# Patient Record
Sex: Female | Born: 1975 | Race: Black or African American | Hispanic: No | Smoking: Former smoker
Health system: Southern US, Community
[De-identification: ages and names within clinical notes are randomized; demographics above are authoritative.]

## PROBLEM LIST (undated history)

## (undated) DIAGNOSIS — Z808 Family history of malignant neoplasm of other organs or systems: Secondary | ICD-10-CM

## (undated) DIAGNOSIS — Z8041 Family history of malignant neoplasm of ovary: Secondary | ICD-10-CM

## (undated) DIAGNOSIS — C50919 Malignant neoplasm of unspecified site of unspecified female breast: Secondary | ICD-10-CM

## (undated) DIAGNOSIS — I10 Essential (primary) hypertension: Secondary | ICD-10-CM

## (undated) DIAGNOSIS — E785 Hyperlipidemia, unspecified: Secondary | ICD-10-CM

## (undated) DIAGNOSIS — F419 Anxiety disorder, unspecified: Secondary | ICD-10-CM

## (undated) DIAGNOSIS — J302 Other seasonal allergic rhinitis: Secondary | ICD-10-CM

## (undated) DIAGNOSIS — Z803 Family history of malignant neoplasm of breast: Secondary | ICD-10-CM

## (undated) HISTORY — DX: Malignant neoplasm of unspecified site of unspecified female breast: C50.919

## (undated) HISTORY — DX: Family history of malignant neoplasm of other organs or systems: Z80.8

## (undated) HISTORY — DX: Family history of malignant neoplasm of breast: Z80.3

## (undated) HISTORY — DX: Family history of malignant neoplasm of ovary: Z80.41

---

## 2003-11-16 ENCOUNTER — Other Ambulatory Visit: Admission: RE | Admit: 2003-11-16 | Discharge: 2003-11-16 | Payer: Self-pay | Admitting: Family Medicine

## 2005-01-22 ENCOUNTER — Other Ambulatory Visit: Admission: RE | Admit: 2005-01-22 | Discharge: 2005-01-22 | Payer: Self-pay | Admitting: Family Medicine

## 2007-03-18 ENCOUNTER — Other Ambulatory Visit: Admission: RE | Admit: 2007-03-18 | Discharge: 2007-03-18 | Payer: Self-pay | Admitting: Family Medicine

## 2008-06-18 ENCOUNTER — Other Ambulatory Visit: Admission: RE | Admit: 2008-06-18 | Discharge: 2008-06-18 | Payer: Self-pay | Admitting: Family Medicine

## 2011-03-10 ENCOUNTER — Other Ambulatory Visit: Payer: Self-pay | Admitting: Specialist

## 2011-03-10 DIAGNOSIS — N921 Excessive and frequent menstruation with irregular cycle: Secondary | ICD-10-CM

## 2011-03-10 DIAGNOSIS — R102 Pelvic and perineal pain: Secondary | ICD-10-CM

## 2011-03-11 ENCOUNTER — Ambulatory Visit
Admission: RE | Admit: 2011-03-11 | Discharge: 2011-03-11 | Disposition: A | Discharge status: Home / Self Care | Payer: Self-pay | Source: Ambulatory Visit | Attending: Specialist | Admitting: Specialist

## 2011-03-11 DIAGNOSIS — N921 Excessive and frequent menstruation with irregular cycle: Secondary | ICD-10-CM

## 2011-03-11 DIAGNOSIS — R102 Pelvic and perineal pain: Secondary | ICD-10-CM

## 2012-11-05 IMAGING — US US PELVIS COMPLETE
1 series · 13 of 25 positions shown · non-contrast
Comparison: None.

CLINICAL DATA: Pelvic pain.  Menometrorrhagia.

TRANSABDOMINAL AND TRANSVAGINAL ULTRASOUND OF PELVIS
TECHNIQUE: Both transabdominal and transvaginal ultrasound
examinations of the pelvis were performed. Transabdominal technique
was performed for global imaging of the pelvis including uterus,
ovaries, adnexal regions, and pelvic cul-de-sac.

[Series 1: us pelvis complete · 0.26mm/px · 13 of 46 slices shown]
[im 1/46]
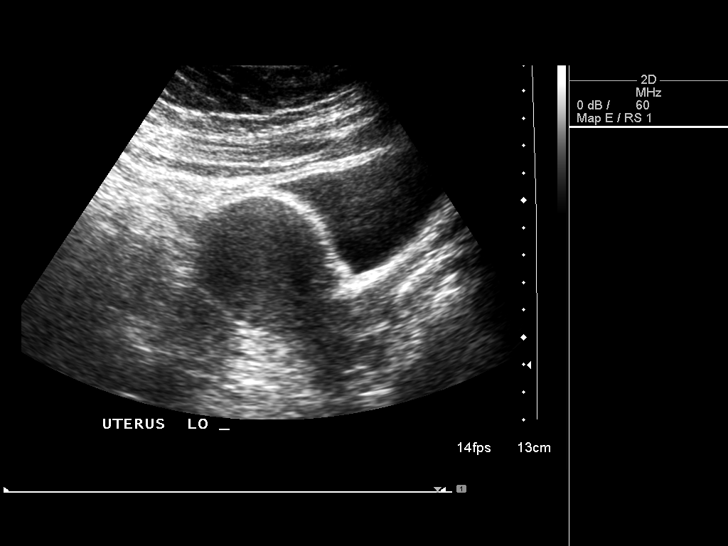
[im 4/46]
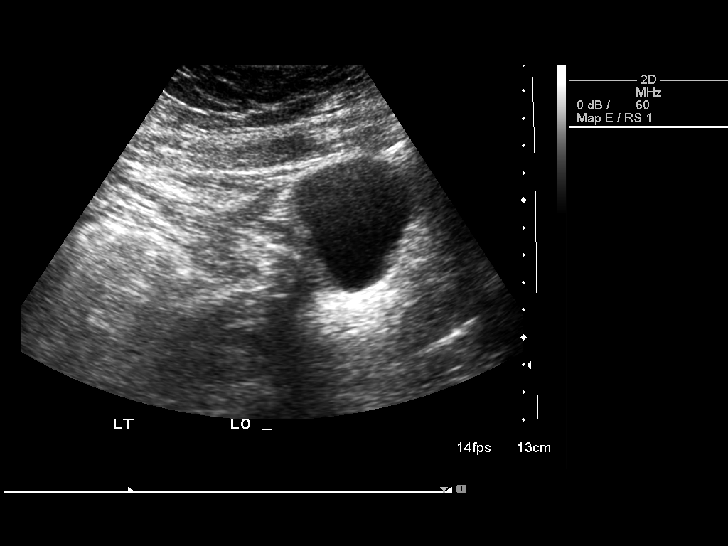
[im 8/46]
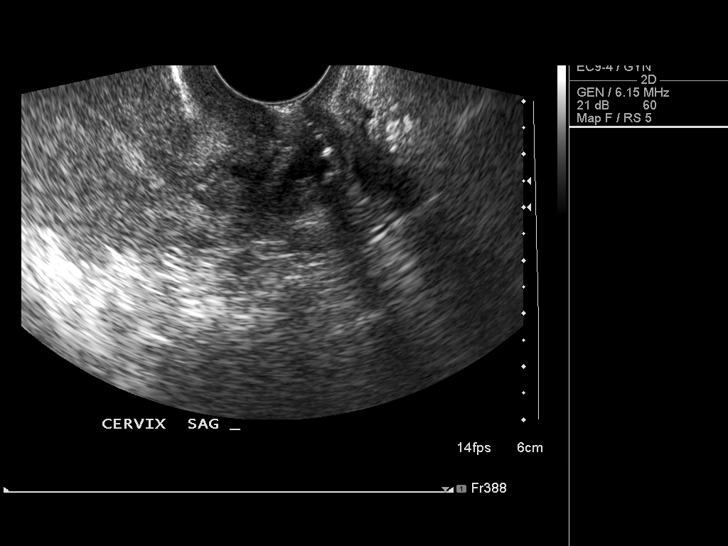
[im 12/46]
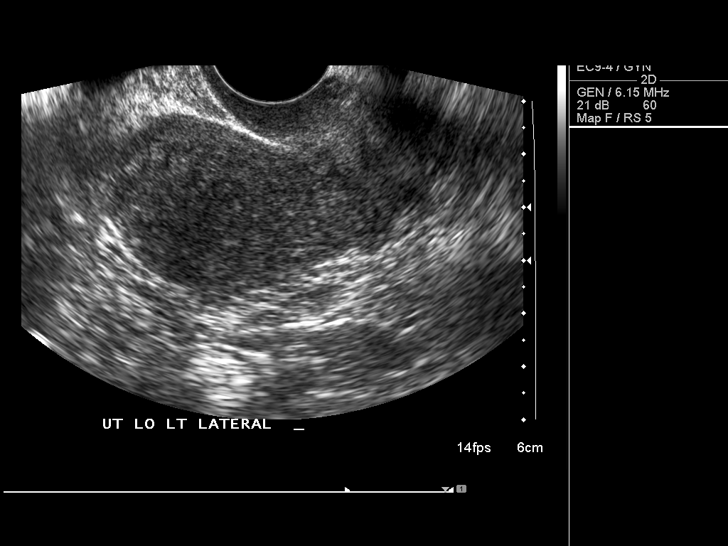
[im 16/46]
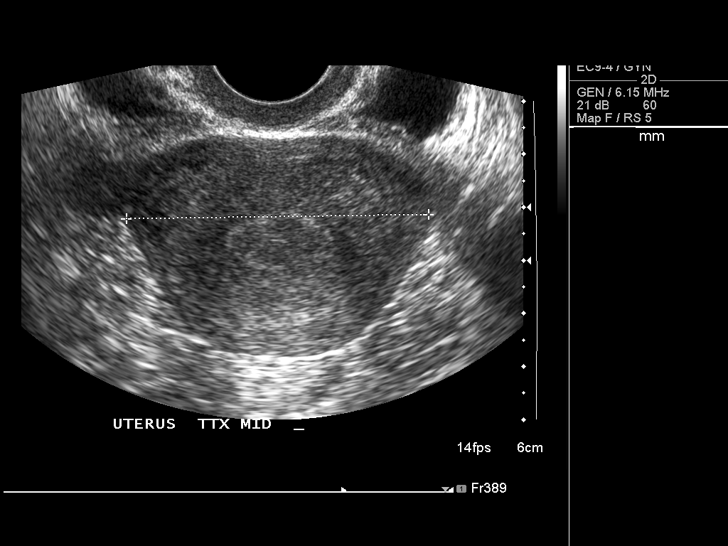
[im 19/46]
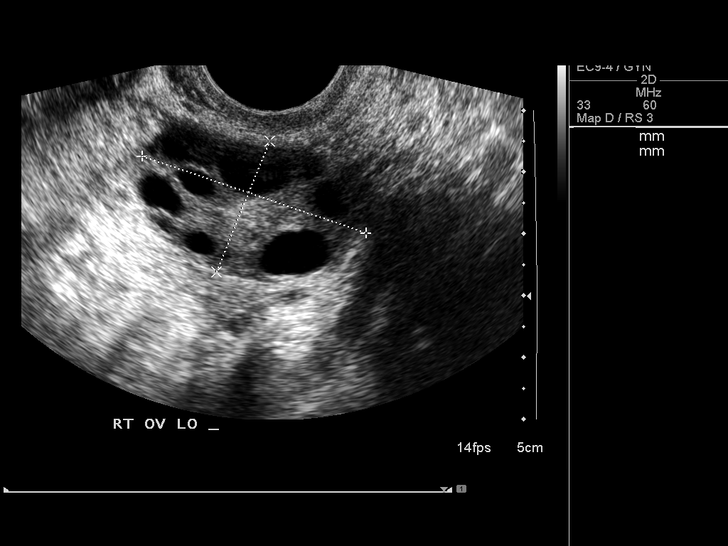
[im 23/46]
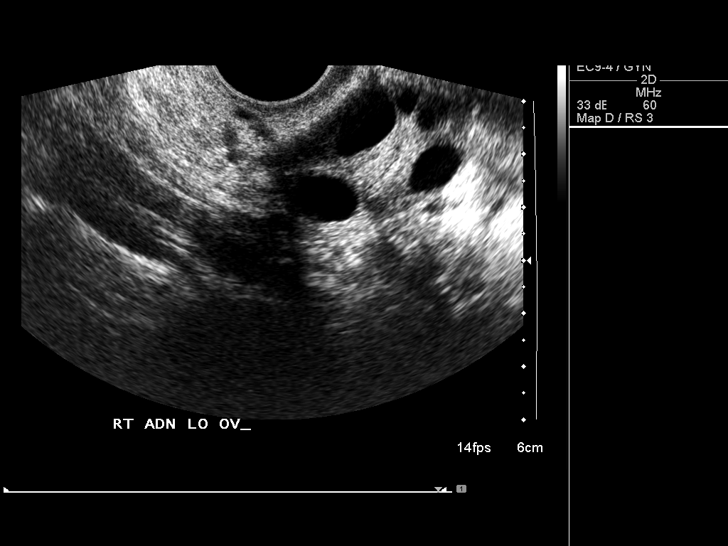
[im 27/46]
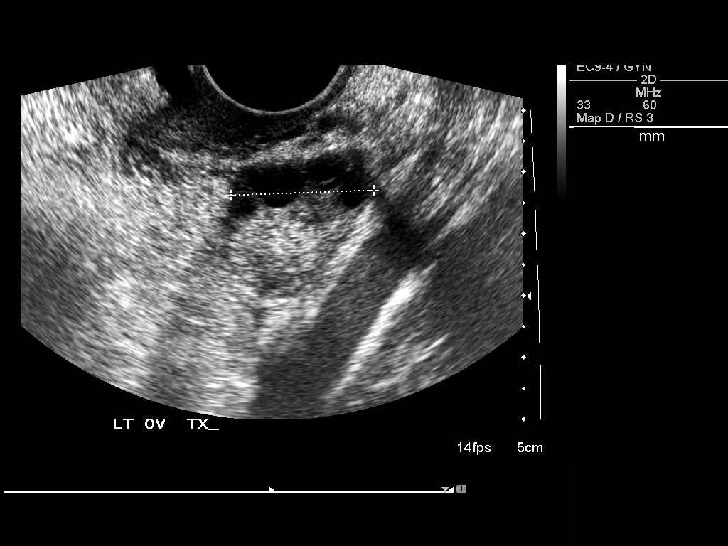
[im 31/46]
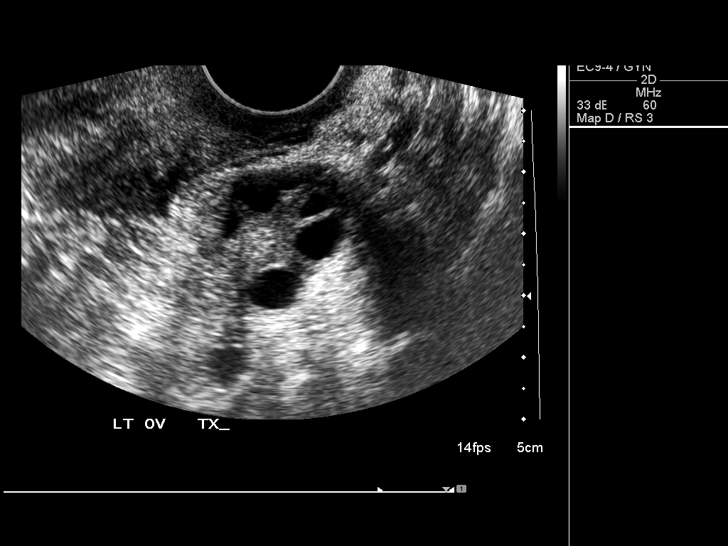
[im 34/46]
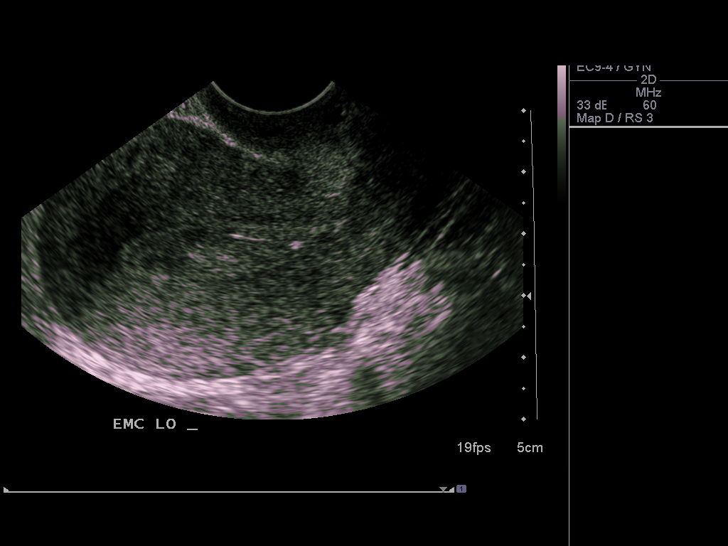
[im 38/46]
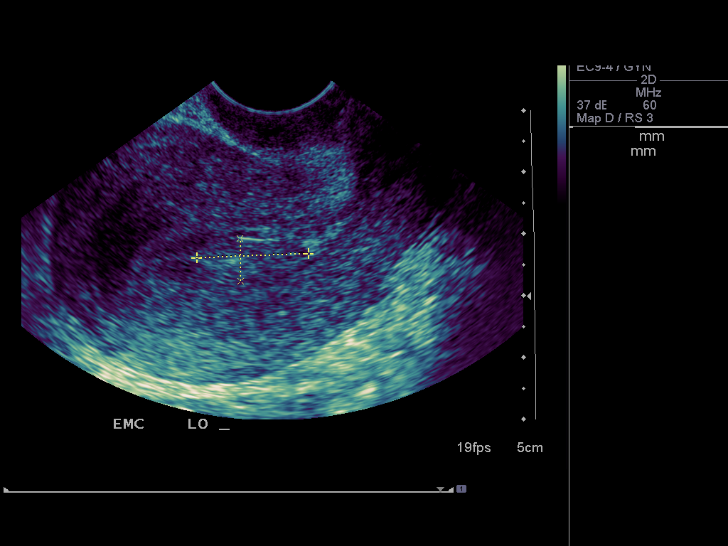
[im 42/46]
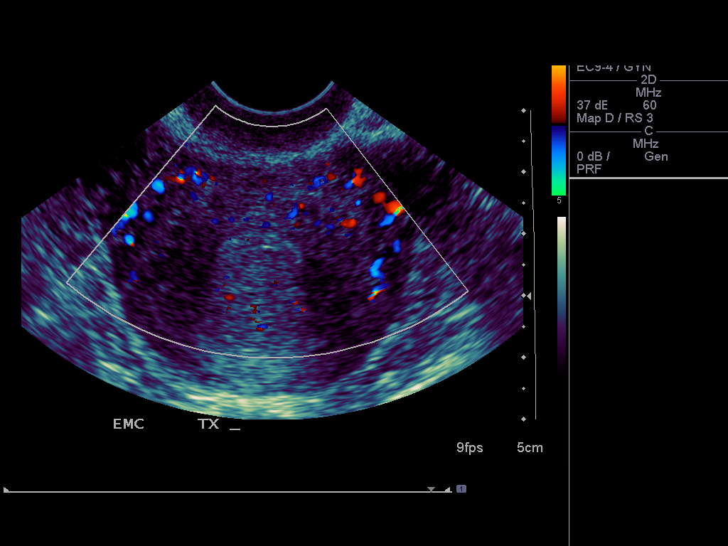
[im 46/46]
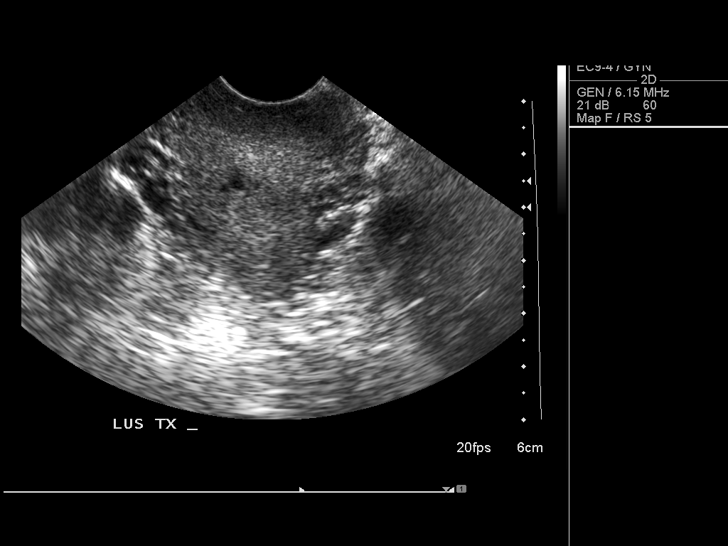

[13 of 25 positions shown; findings below may reference images not displayed]

It was necessary to proceed with endovaginal exam following the
transabdominal exam to visualize the endometrium, ovaries and
adnexal regions.
FINDINGS: Uterus: Measures 8.6 x 4.3 x 5.7 cm and may be somewhat
heterogeneous in echotexture.

Endometrium: Measures 9 mm.  There may be a hypoechoic structure in
the endometrial canal with internal flow, measuring approximately
1.8 x 0.7 x 1.0 cm.

Right ovary:  Measures 3.8 x 2.3 x 2.4 cm with peripherally
oriented follicles.

Left ovary: Measures 4.3 x 2.4 x 2.3 cm with peripherally oriented
follicles.

Other findings: No free fluid.
IMPRESSION: 1.  Uterine heterogeneity can be seen with adenomyosis.
2.  Question a hypoechoic vascular structure in the endometrium.  A
polyp could have this appearance. Sonohysterography may be helpful
in further evaluation, as clinically indicated.
3.  Peripherally oriented ovarian follicles can be seen with
polycystic ovarian disease.

## 2015-06-20 ENCOUNTER — Other Ambulatory Visit: Payer: Self-pay

## 2015-06-20 DIAGNOSIS — Z1231 Encounter for screening mammogram for malignant neoplasm of breast: Secondary | ICD-10-CM

## 2015-07-15 ENCOUNTER — Ambulatory Visit: Payer: BC Managed Care – PPO

## 2019-06-09 ENCOUNTER — Other Ambulatory Visit: Payer: Self-pay

## 2019-06-09 DIAGNOSIS — Z20822 Contact with and (suspected) exposure to covid-19: Secondary | ICD-10-CM

## 2019-06-12 ENCOUNTER — Telehealth: Payer: Self-pay | Admitting: Hematology

## 2019-06-12 LAB — NOVEL CORONAVIRUS, NAA: SARS-CoV-2, NAA: NOT DETECTED

## 2019-06-12 NOTE — Telephone Encounter (Signed)
Test, documented in error.

## 2024-03-29 ENCOUNTER — Telehealth: Payer: Self-pay | Admitting: *Deleted

## 2024-03-29 NOTE — Telephone Encounter (Signed)
 Spoke to patient to confirm upcoming afternoon [facility] clinic appointment on 10/8, paperwork will be sent via [facility] location and time, also informed patient that the surgeon's office would be calling as well to get information from them similar to the packet that they will be receiving so make sure to do both.  Reminded patient that all providers will be coming to the clinic to see them HERE and if they had any questions to not hesitate to reach back out to myself or their navigators.

## 2024-03-29 NOTE — Telephone Encounter (Signed)
 LVM for patient to return call left callback number for 10/8 [facility] appointment

## 2024-04-02 ENCOUNTER — Encounter: Payer: Self-pay | Admitting: *Deleted

## 2024-04-03 ENCOUNTER — Encounter: Payer: Self-pay | Admitting: *Deleted

## 2024-04-03 DIAGNOSIS — C50411 Malignant neoplasm of upper-outer quadrant of right female breast: Secondary | ICD-10-CM | POA: Insufficient documentation

## 2024-04-03 NOTE — Progress Notes (Signed)
 Radiation Oncology         [phone] ________________________________  Name: Angela Marshall        MRN: [id number]  Date of Service: 04/05/2024 DOB: 11-Jan-1976  CC:No primary care provider on file.  Vernetta Berg, MD     REFERRING PHYSICIAN: Vernetta Berg, MD   DIAGNOSIS: The encounter diagnosis was Malignant neoplasm of upper-outer quadrant of right breast in female, estrogen receptor positive (HCC).   HISTORY OF PRESENT ILLNESS: Angela Marshall is a 48 y.o. female seen in the multidisciplinary breast clinic for a new diagnosis of right breast cancer. The patient was noted to have ***.    PREVIOUS RADIATION THERAPY: {EXAM; YES/NO:19492::No}   PAST MEDICAL HISTORY: No past medical history on file.     PAST SURGICAL HISTORY:*** The histories are not reviewed yet. Please review them in the History navigator section and refresh this SmartLink.   FAMILY HISTORY: No family history on file.   SOCIAL HISTORY:   The patient is single and lives in [place]. She works as a Loss adjuster, chartered at [employer].    ALLERGIES: Patient has no allergy information on record.   MEDICATIONS:  No current outpatient medications on file.   No current facility-administered medications for this visit.     REVIEW OF SYSTEMS: On review of systems, the patient reports that she is doing ***     PHYSICAL EXAM:  Wt Readings from Last 3 Encounters:  No data found for Wt   Temp Readings from Last 3 Encounters:  No data found for Temp   BP Readings from Last 3 Encounters:  No data found for BP   Pulse Readings from Last 3 Encounters:  No data found for Pulse    In general this is a well appearing *** female in no acute distress. She's alert and oriented x4 and appropriate throughout the examination. Cardiopulmonary assessment is negative for acute distress and she exhibits normal effort. Bilateral breast exam is deferred.    ECOG = ***  0 - Asymptomatic (Fully  active, able to carry on all predisease activities without restriction)  1 - Symptomatic but completely ambulatory (Restricted in physically strenuous activity but ambulatory and able to carry out work of a light or sedentary nature. For example, light housework, office work)  2 - Symptomatic, <50% in bed during the day (Ambulatory and capable of all self care but unable to carry out any work activities. Up and about more than 50% of waking hours)  3 - Symptomatic, >50% in bed, but not bedbound (Capable of only limited self-care, confined to bed or chair 50% or more of waking hours)  4 - Bedbound (Completely disabled. Cannot carry on any self-care. Totally confined to bed or chair)  5 - Death   Raylene MM, Creech RH, Tormey DC, et al. [phone]). Toxicity and response criteria of the [facility]. Am. DOROTHA Bridges. Oncol. 5 (6): 649-55    LABORATORY DATA:  No results found for: WBC, HGB, HCT, MCV, PLT No results found for: NA, K, CL, CO2 No results found for: ALT, AST, GGT, ALKPHOS, BILITOT    RADIOGRAPHY: No results found.     IMPRESSION/PLAN: 1. Stage *** of the right breast.  2. Possible genetic predisposition to malignancy. The patient is a candidate for genetic testing given *** personal and family history. She will meet with our geneticist today in clinic.   In a visit lasting *** minutes, greater than 50% of the time was spent face  to face reviewing her case, as well as in preparation of, discussing, and coordinating the patient's care.  The above documentation reflects my direct findings during this shared patient visit. Please see the separate note by Dr. Dewey on this date for the remainder of the patient's plan of care.    Donald KYM Husband, [facility]    **Disclaimer: This note was dictated with voice recognition software. Similar sounding words can inadvertently be transcribed and this note may contain transcription errors which  may not have been corrected upon publication of note.**

## 2024-04-04 ENCOUNTER — Encounter: Payer: Self-pay | Admitting: *Deleted

## 2024-04-05 ENCOUNTER — Ambulatory Visit: Payer: Self-pay | Attending: Surgery | Admitting: Physical Therapy

## 2024-04-05 ENCOUNTER — Encounter: Payer: Self-pay | Admitting: *Deleted

## 2024-04-05 ENCOUNTER — Other Ambulatory Visit: Payer: Self-pay

## 2024-04-05 ENCOUNTER — Ambulatory Visit
Admission: RE | Admit: 2024-04-05 | Discharge: 2024-04-05 | Disposition: A | Discharge status: Home / Self Care | Payer: Self-pay | Source: Ambulatory Visit | Attending: Radiation Oncology | Admitting: Radiation Oncology

## 2024-04-05 ENCOUNTER — Telehealth: Payer: Self-pay | Admitting: *Deleted

## 2024-04-05 ENCOUNTER — Encounter: Payer: Self-pay | Admitting: General Practice

## 2024-04-05 ENCOUNTER — Encounter: Payer: Self-pay | Admitting: Physical Therapy

## 2024-04-05 ENCOUNTER — Other Ambulatory Visit: Payer: Self-pay | Admitting: *Deleted

## 2024-04-05 ENCOUNTER — Inpatient Hospital Stay: Payer: Self-pay | Attending: Hematology and Oncology | Admitting: Hematology and Oncology

## 2024-04-05 ENCOUNTER — Ambulatory Visit

## 2024-04-05 ENCOUNTER — Other Ambulatory Visit: Payer: Self-pay | Admitting: Surgery

## 2024-04-05 VITALS — BP 134/84 | HR 85 | Temp 98.7°F | Resp 16 | Ht 65.35 in | Wt 216.3 lb

## 2024-04-05 DIAGNOSIS — R531 Weakness: Secondary | ICD-10-CM | POA: Insufficient documentation

## 2024-04-05 DIAGNOSIS — N6002 Solitary cyst of left breast: Secondary | ICD-10-CM | POA: Diagnosis not present

## 2024-04-05 DIAGNOSIS — Z809 Family history of malignant neoplasm, unspecified: Secondary | ICD-10-CM | POA: Diagnosis not present

## 2024-04-05 DIAGNOSIS — Z8041 Family history of malignant neoplasm of ovary: Secondary | ICD-10-CM | POA: Diagnosis not present

## 2024-04-05 DIAGNOSIS — R410 Disorientation, unspecified: Secondary | ICD-10-CM | POA: Insufficient documentation

## 2024-04-05 DIAGNOSIS — R110 Nausea: Secondary | ICD-10-CM | POA: Insufficient documentation

## 2024-04-05 DIAGNOSIS — Z170 Estrogen receptor positive status [ER+]: Secondary | ICD-10-CM | POA: Diagnosis present

## 2024-04-05 DIAGNOSIS — K5903 Drug induced constipation: Secondary | ICD-10-CM | POA: Insufficient documentation

## 2024-04-05 DIAGNOSIS — Z808 Family history of malignant neoplasm of other organs or systems: Secondary | ICD-10-CM

## 2024-04-05 DIAGNOSIS — R5383 Other fatigue: Secondary | ICD-10-CM | POA: Insufficient documentation

## 2024-04-05 DIAGNOSIS — Z5111 Encounter for antineoplastic chemotherapy: Secondary | ICD-10-CM | POA: Insufficient documentation

## 2024-04-05 DIAGNOSIS — Z803 Family history of malignant neoplasm of breast: Secondary | ICD-10-CM | POA: Insufficient documentation

## 2024-04-05 DIAGNOSIS — Z87891 Personal history of nicotine dependence: Secondary | ICD-10-CM | POA: Diagnosis not present

## 2024-04-05 DIAGNOSIS — Z1722 Progesterone receptor negative status: Secondary | ICD-10-CM | POA: Diagnosis not present

## 2024-04-05 DIAGNOSIS — F419 Anxiety disorder, unspecified: Secondary | ICD-10-CM | POA: Diagnosis not present

## 2024-04-05 DIAGNOSIS — Z635 Disruption of family by separation and divorce: Secondary | ICD-10-CM | POA: Insufficient documentation

## 2024-04-05 DIAGNOSIS — Z1732 Human epidermal growth factor receptor 2 negative status: Secondary | ICD-10-CM | POA: Insufficient documentation

## 2024-04-05 DIAGNOSIS — Z79899 Other long term (current) drug therapy: Secondary | ICD-10-CM | POA: Diagnosis not present

## 2024-04-05 DIAGNOSIS — Z171 Estrogen receptor negative status [ER-]: Secondary | ICD-10-CM | POA: Diagnosis not present

## 2024-04-05 DIAGNOSIS — Z880 Allergy status to penicillin: Secondary | ICD-10-CM | POA: Diagnosis not present

## 2024-04-05 DIAGNOSIS — R293 Abnormal posture: Secondary | ICD-10-CM | POA: Insufficient documentation

## 2024-04-05 DIAGNOSIS — C50411 Malignant neoplasm of upper-outer quadrant of right female breast: Secondary | ICD-10-CM

## 2024-04-05 DIAGNOSIS — Z5112 Encounter for antineoplastic immunotherapy: Secondary | ICD-10-CM | POA: Insufficient documentation

## 2024-04-05 DIAGNOSIS — R12 Heartburn: Secondary | ICD-10-CM | POA: Diagnosis not present

## 2024-04-05 DIAGNOSIS — E876 Hypokalemia: Secondary | ICD-10-CM | POA: Diagnosis not present

## 2024-04-05 DIAGNOSIS — T451X5A Adverse effect of antineoplastic and immunosuppressive drugs, initial encounter: Secondary | ICD-10-CM | POA: Diagnosis not present

## 2024-04-05 LAB — CBC WITH DIFFERENTIAL (CANCER CENTER ONLY)
Abs Immature Granulocytes: 0.01 K/uL (ref 0.00–0.07)
Basophils Absolute: 0 K/uL (ref 0.0–0.1)
Basophils Relative: 1 %
Eosinophils Absolute: 0.1 K/uL (ref 0.0–0.5)
Eosinophils Relative: 2 %
HCT: 35.7 % — ABNORMAL LOW (ref 36.0–46.0)
Hemoglobin: 12.6 g/dL (ref 12.0–15.0)
Immature Granulocytes: 0 %
Lymphocytes Relative: 50 %
Lymphs Abs: 3 K/uL (ref 0.7–4.0)
MCH: 30 pg (ref 26.0–34.0)
MCHC: 35.3 g/dL (ref 30.0–36.0)
MCV: 85 fL (ref 80.0–100.0)
Monocytes Absolute: 0.5 K/uL (ref 0.1–1.0)
Monocytes Relative: 9 %
Neutro Abs: 2.2 K/uL (ref 1.7–7.7)
Neutrophils Relative %: 38 %
Platelet Count: 216 K/uL (ref 150–400)
RBC: 4.2 MIL/uL (ref 3.87–5.11)
RDW: 12.9 % (ref 11.5–15.5)
WBC Count: 5.9 K/uL (ref 4.0–10.5)
nRBC: 0 % (ref 0.0–0.2)

## 2024-04-05 LAB — CMP (CANCER CENTER ONLY)
ALT: 27 U/L (ref 0–44)
AST: 19 U/L (ref 15–41)
Albumin: 4.4 g/dL (ref 3.5–5.0)
Alkaline Phosphatase: 52 U/L (ref 38–126)
Anion gap: 6 (ref 5–15)
BUN: 17 mg/dL (ref 6–20)
CO2: 29 mmol/L (ref 22–32)
Calcium: 9.6 mg/dL (ref 8.9–10.3)
Chloride: 104 mmol/L (ref 98–111)
Creatinine: 1.21 mg/dL — ABNORMAL HIGH (ref 0.44–1.00)
GFR, Estimated: 55 mL/min — ABNORMAL LOW (ref >60)
Glucose, Bld: 89 mg/dL (ref 70–99)
Potassium: 3.2 mmol/L — ABNORMAL LOW (ref 3.5–5.1)
Sodium: 139 mmol/L (ref 135–145)
Total Bilirubin: 0.8 mg/dL (ref 0.0–1.2)
Total Protein: 7.6 g/dL (ref 6.5–8.1)

## 2024-04-05 LAB — GENETIC SCREENING ORDER

## 2024-04-05 NOTE — Progress Notes (Addendum)
 [facility] CONSULT NOTE  Patient Care Team: Lenon Nell SAILOR, FNP as PCP - General (Nurse Practitioner) Gerome, Devere HERO, RN as Oncology Nurse Navigator Tyree Nanetta SAILOR, RN as Oncology Nurse Navigator Vernetta Berg, MD as Consulting Physician (General Surgery) Odean Potts, MD as Consulting Physician (Hematology and Oncology) Dewey Rush, MD as Consulting Physician (Radiation Oncology)  CHIEF COMPLAINTS/PURPOSE OF CONSULTATION:  Newly diagnosed breast cancer  HISTORY OF PRESENTING ILLNESS:  History of Present Illness Angela Marshall is a 48 year old female with invasive ductal carcinoma who presents for a consultation regarding treatment options. She underwent a screening mammogram that detected a right upper quadrant and breast mass measuring 2.6 cm and by ultrasound it measured 2.2 cm at 10 o'clock position axilla was negative.  Biopsy was grade 2 invasive ductal carcinoma ER 2%, PR 0% Ki67 60%, HER2 negative.  She was presented this morning to the multidisciplinary tumor board and she is here today to discuss treatment options.  She is here by herself.     I reviewed her records extensively and collaborated the history with the patient.  SUMMARY OF ONCOLOGIC HISTORY: Oncology History  Malignant neoplasm of upper-outer quadrant of right breast in female, estrogen receptor positive (HCC)  03/30/2024 Initial Diagnosis   Screening mammogram detected right upper outer quadrant mass 2.6 cm by mammogram, by ultrasound it measured 2.2 cm at 10 o'clock position, axilla negative.  Left breast: Cysts: Aspirated Biopsy: Grade 3 IDC ER 2%, PR 0%, Ki67 60%, HER2 negative   04/05/2024 Cancer Staging   Staging form: Breast, AJCC 8th Edition - Clinical: Stage IIB (cT2, cN0, cM0, G3, ER-, PR-, HER2-) - Signed by Odean Potts, MD on 04/05/2024 Stage prefix: Initial diagnosis Histologic grading system: 3 grade system      MEDICAL HISTORY:  No prior medical  problems  SURGICAL HISTORY: No prior surgeries  SOCIAL HISTORY: Denies any tobacco alcohol or recreational drug use She has a friend and sister who are her support. 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  FAMILY HISTORY: No family history of breast cancer  ALLERGIES:  is allergic to penicillins.  MEDICATIONS:  Current Outpatient Medications  Medication Sig Dispense Refill   cetirizine (ZYRTEC) 10 MG tablet Take 10 mg by mouth.     Vitamin D, Ergocalciferol, (DRISDOL) 1.25 MG (50000 UNIT) CAPS capsule Take 50,000 Units by mouth 2 (two) times a week.     No current facility-administered medications for this visit.    REVIEW OF SYSTEMS:   Constitutional: Denies fevers, chills or abnormal night sweats   All other systems were reviewed with the patient and are negative.  PHYSICAL EXAMINATION: ECOG PERFORMANCE STATUS: 0 - Asymptomatic  Vitals:   04/05/24 1304  BP: 134/84  Pulse: 85  Resp: 16  Temp: 98.7 F (37.1 C)  SpO2: 98%   Filed Weights   04/05/24 1304  Weight: 216 lb 4.8 oz (98.1 kg)    GENERAL:alert, no distress and comfortable    LABORATORY DATA:  I have reviewed the data as listed Lab Results  Component Value Date   WBC 5.9 04/05/2024   HGB 12.6 04/05/2024   HCT 35.7 (L) 04/05/2024   MCV 85.0 04/05/2024   PLT 216 04/05/2024   Lab Results  Component Value Date   NA 139 04/05/2024   K 3.2 (L) 04/05/2024   CL 104 04/05/2024   CO2 29 04/05/2024    RADIOGRAPHIC  STUDIES: I have personally reviewed the radiological reports and agreed with the findings in the report.  ASSESSMENT AND PLAN:  Malignant neoplasm of upper-outer quadrant of right breast in female, estrogen receptor positive (HCC) 03/30/2024:Screening mammogram detected right upper outer quadrant mass 2.6 cm by mammogram, by ultrasound it measured 2.2 cm at 10 o'clock position, axilla negative.  Left breast: Cysts:  Aspirated Biopsy: Grade 3 IDC ER 2%, PR 0%, Ki67 60%, HER2 negative  Pathology and radiology counseling: Discussed with the patient, the details of pathology including the type of breast cancer,the clinical staging, the significance of ER, PR and HER-2/neu receptors and the implications for treatment. After reviewing the pathology in detail, we proceeded to discuss the different treatment options between surgery, radiation, chemotherapy  Treatment plan: Neoadjuvant chemotherapy with Taxol carbo Keytruda followed by Adriamycin Cytoxan Keytruda followed by Sharion maintenance for 1 year versus Taxotere Cytoxan Keytruda every 3 weeks for 6 cycles (Scarlet clinical trial) Breast conserving surgery with sentinel lymph node biopsy Adjuvant radiation  Patient will also be counseled for Scarlet clinical trial Chemotherapy Counseling: I discussed the risks and benefits of chemotherapy including the risks of nausea/ vomiting, risk of infection from low WBC count, fatigue due to chemo or anemia, bruising or bleeding due to low platelets, mouth sores, loss/ change in taste and decreased appetite. Liver and kidney function will be monitored through out chemotherapy as abnormalities in liver and kidney function may be a side effect of treatment. Cardiac dysfunction due to Adriamycin and neuropathy risk from Taxol as well as immune mediated adverse effects from Keytruda were discussed in detail. Risk of permanent bone marrow dysfunction and leukemia due to chemo were also discussed.  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  Unfortunately she does not have benefits.  All questions were answered. The patient knows to call the clinic with any problems, questions or concerns. I personally spent a total of 60 minutes in the care of the patient today including preparing to see the patient, getting/reviewing separately obtained  history, performing a medically appropriate exam/evaluation, counseling and educating, placing orders, referring and communicating with other health care professionals, documenting clinical information in the EHR, independently interpreting results, communicating results, and coordinating care.   Viinay K Curtina Grills, MD 04/05/24

## 2024-04-05 NOTE — Research (Signed)
 Trial:  [id number]: SHORTER ANTHRACYCLINE-FREE CHEMO IMMUNOTHERAPY ADAPTED TO PATHOLOGICAL RESPONSE IN EARLY TRIPLE NEGATIVE BREAST CANCER (SCARLET), A RANDOMIZED PHASE III STUDY  Patient Angela Marshall was identified by Dr. Gudena as a potential candidate for the above listed study.  This Clinical Research Nurse met with ALAYLA DETHLEFS, [id number], on 04/05/24 in a manner and location that ensures patient privacy to discuss participation in the above listed research study.  Patient is Unaccompanied.  A copy of the informed consent document and separate HIPAA Authorization was provided to the patient.  Patient reads, speaks, and understands [place].   Patient was provided with the business card of this Nurse and encouraged to contact the research team with any questions.  Approximately 10 minutes were spent with the patient reviewing the informed consent documents.  Patient was provided the option of taking informed consent documents home to review and was encouraged to review at their convenience with their support network, including other care providers. Patient took the consent documents home to review. Cherylyn Hoard, BSN, RN, [employer] Research Nurse II [phone] 04/05/2024 2:55 PM

## 2024-04-05 NOTE — Telephone Encounter (Signed)
 When appts were reviewed with patient at [facility] today she asked if chemo class could be on Monday when ECHO was. Navigator stated would check schedule and call her back. Checked with chemo RN and then left patient message at 5pm stating no other chemo ed appts are available on requested day so she will need to stay as scheduled for Friday 17th.  Patient wanted to know where to take her forms from work for [employer]. Left on message to bring to [facility] as she is having chemo prior to surgery. Encouraged call back for any other questions.

## 2024-04-05 NOTE — Therapy (Signed)
 OUTPATIENT PHYSICAL THERAPY BREAST CANCER BASELINE EVALUATION   Patient Name: Angela Marshall MRN: [id number] DOB:Nov 04, 1975, 48 y.o., female Today's Date: 04/05/2024  END OF SESSION:  PT End of Session - 04/05/24 1436     Visit Number 1    Number of Visits 2    Date for Recertification  10/04/24    PT Start Time 1307    PT Stop Time 1340    PT Time Calculation (min) 33 min    Activity Tolerance Patient tolerated treatment well    Behavior During Therapy [facility] for tasks assessed/performed          History reviewed. No pertinent past medical history. History reviewed. No pertinent surgical history. Patient Active Problem List   Diagnosis Date Noted   Malignant neoplasm of upper-outer quadrant of right breast in female, estrogen receptor positive (HCC) 04/03/2024    REFERRING PROVIDER: Dr. Vicenta Poli  REFERRING DIAG: Right breast cancer  THERAPY DIAG:  Malignant neoplasm of upper-outer quadrant of right breast in female, estrogen receptor positive (HCC)  Abnormal posture  Rationale for Evaluation and Treatment: Rehabilitation  ONSET DATE: 03/10/2024  SUBJECTIVE:                                                                                                                                                                                           SUBJECTIVE STATEMENT: Patient reports she is here today to be seen by her medical team for her newly diagnosed right breast cancer.   PERTINENT HISTORY:  Patient was diagnosed on 03/10/2024 with right grade 3 invasive ductal carcinoma breast cancer. It measures 2.6 cm and is located in the upper outer quadrant. It is weakly ER positive, PR and HER2 negative with a Ki67 of 60%.   PATIENT GOALS:   reduce lymphedema risk and learn post op HEP.   PAIN:  Are you having pain? No  PRECAUTIONS: Active CA   RED FLAGS: None   HAND DOMINANCE: right  WEIGHT BEARING RESTRICTIONS: No  FALLS:  Has patient fallen in last 6  months? 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  COGNITION: Overall cognitive status: Within functional limits for tasks assessed    POSTURE:  Forward head and rounded shoulders posture  UPPER EXTREMITY AROM/PROM:  A/PROM RIGHT   eval   Shoulder extension 40  Shoulder flexion 142  Shoulder abduction 163  Shoulder internal rotation 64  Shoulder external rotation 90    (Blank rows = not tested)  A/PROM LEFT   eval  Shoulder extension 38  Shoulder flexion 141  Shoulder abduction 149  Shoulder internal rotation 64  Shoulder external rotation 89    (Blank rows = not tested)  CERVICAL AROM: All within normal limits  UPPER EXTREMITY STRENGTH: WNL  LYMPHEDEMA ASSESSMENTS (in cm):   LANDMARK RIGHT   eval  10 cm proximal to olecranon process 37.2  Olecranon process 28.9  10 cm proximal to ulnar styloid process 28.2  Just proximal to ulnar styloid process 19.8  Across hand at thumb web space 20.3  At base of 2nd digit 6.5  (Blank rows = not tested)  LANDMARK LEFT   eval  10 cm proximal to olecranon process 36.1  Olecranon process 29.4  10 cm proximal to ulnar styloid process 26.5  Just proximal to ulnar styloid process 18.7  Across hand at thumb web space 19.2  At base of 2nd digit 6.3  (Blank rows = not tested)  L-DEX LYMPHEDEMA SCREENING:  The patient was assessed using the L-Dex machine today to produce a lymphedema index baseline score. The patient will be reassessed on a regular basis (typically every 3 months) to obtain new L-Dex scores. If the score is > 6.5 points away from his/her baseline score indicating onset of subclinical lymphedema, it will be  recommended to wear a compression garment for 4 weeks, 12 hours per day and then be reassessed. If the score continues to be > 6.5 points from baseline at reassessment, we will initiate lymphedema treatment. Assessing in this manner has a 95% rate of preventing clinically significant lymphedema.   L-DEX FLOWSHEETS - 04/05/24 1400       L-DEX LYMPHEDEMA SCREENING   Measurement Type Unilateral    L-DEX MEASUREMENT EXTREMITY Upper Extremity    POSITION  Standing    DOMINANT SIDE Right    At Risk Side Right    BASELINE SCORE (UNILATERAL) 1.5          QUICK DASH SURVEY:  Angela Marshall - 04/05/24 0001     Open a tight or new jar No difficulty    Do heavy household chores (wash walls, wash floors) No difficulty    Carry a shopping bag or briefcase No difficulty    Wash your back No difficulty    Use a knife to cut food No difficulty    Recreational activities in which you take some force or impact through your arm, shoulder, or hand (golf, hammering, tennis) No difficulty    During the past week, to what extent has your arm, shoulder or hand problem interfered with your normal social activities with family, friends, neighbors, or groups? Not at all    During the past week, to what extent has your arm, shoulder or hand problem limited your work or other regular daily activities Not at all    Arm, shoulder, or hand pain. Mild    Tingling (pins and needles) in your arm, shoulder, or hand Mild    Difficulty Sleeping Moderate difficulty    DASH Score 9.09 %           PATIENT EDUCATION:  Education details: Time spent educating patient on aspects of self-care to maximize post op recovery. Patient was educated on where and how to get a post op compression bra to use to reduce post op edema. Patient was also educated on the use of SOZO screenings and  surveillance principles for early identification of lymphedema onset. She was instructed to use the post op pillow in the axilla for pressure and  pain relief. Patient educated on lymphedema risk reduction and post op shoulder/posture HEP. Person educated: Patient Education method: Explanation, Demonstration, Handout Education comprehension: Patient verbalized understanding and returned demonstration  HOME EXERCISE PROGRAM: Patient was instructed today in a home exercise program today for post op shoulder range of motion. These included active assist shoulder flexion in sitting, scapular retraction, wall walking with shoulder abduction, and hands behind head external rotation.  She was encouraged to do these twice a day, holding 3 seconds and repeating 5 times when permitted by her physician.   ASSESSMENT:  CLINICAL IMPRESSION: Patient was diagnosed on 03/10/2024 with right grade 3 invasive ductal carcinoma breast cancer. It measures 2.6 cm and is located in the upper outer quadrant. It is weakly ER positive, PR and HER2 negative with a Ki67 of 60%.Her multidisciplinary medical team met prior to her assessments to determine a recommended treatment plan. She is planning to have neoadjuvant chemotherapy followed by a right lumpectomy and sentinel node biopsy and radiation. She will benefit from a post op PT reassessment to determine needs and from L-Dex screens every 3 months for 2 years to detect subclinical lymphedema.  Pt will benefit from skilled therapeutic intervention to improve on the following deficits: Decreased knowledge of precautions, impaired UE functional use, pain, decreased ROM, postural dysfunction.   PT treatment/interventions: ADL/self-care home management, pt/family education, therapeutic exercise  REHAB POTENTIAL: Excellent  CLINICAL DECISION MAKING: Stable/uncomplicated  EVALUATION COMPLEXITY: Low   GOALS: Goals reviewed with patient? YES  LONG TERM GOALS: (STG=LTG)    Name Target Date Goal status  1 Pt will be able to verbalize understanding of pertinent lymphedema risk reduction practices relevant to her dx  specifically related to skin care.  Baseline:  No knowledge 04/05/2024 Achieved at eval  2 Pt will be able to return demo and/or verbalize understanding of the post op HEP related to regaining shoulder ROM. Baseline:  No knowledge 04/05/2024 Achieved at eval  3 Pt will be able to verbalize understanding of the importance of viewing the post op After Breast CA Class video for further lymphedema risk reduction education and therapeutic exercise.  Baseline:  No knowledge 04/05/2024 Achieved at eval  4 Pt will demo she has regained full shoulder ROM and function post operatively compared to baselines.  Baseline: See objective measurements taken today. 10/05/2023     PLAN:  PT FREQUENCY/DURATION: EVAL and 1 follow up appointment.   PLAN FOR NEXT SESSION: will reassess 3-4 weeks post op to determine needs.   Patient will follow up at outpatient cancer rehab 3-4 weeks following surgery.  If the patient requires physical therapy at that time, a specific plan will be dictated and sent to the referring physician for approval. The patient was educated today on appropriate basic range of motion exercises to begin post operatively and the importance of viewing the After Breast Cancer class video following surgery.  Patient was educated today on lymphedema risk reduction practices as it pertains to recommendations that will benefit the patient immediately following surgery.  She verbalized good understanding.    Physical Therapy Information for After Breast Cancer Surgery/Treatment:  Lymphedema is a swelling condition that you may be at risk for in your arm if you have lymph nodes removed from the armpit area.  After a sentinel node biopsy, the risk is approximately 5-9% and is higher after an  axillary node dissection.  There is treatment available for this condition and it is not life-threatening.  Contact your physician or physical therapist with concerns. You may begin the 4 shoulder/posture exercises (see  additional sheet) when permitted by your physician (typically a week after surgery).  If you have drains, you may need to wait until those are removed before beginning range of motion exercises.  A general recommendation is to not lift your arms above shoulder height until drains are removed.  These exercises should be done to your tolerance and gently.  This is not a no pain/no gain type of recovery so listen to your body and stretch into the range of motion that you can tolerate, stopping if you have pain.  If you are having immediate reconstruction, ask your plastic surgeon about doing exercises as he or she may want you to wait. We encourage you to view the After Breast Cancer class video following surgery.  You will learn information related to lymphedema risk, prevention and treatment and additional exercises to regain mobility following surgery.   While undergoing any medical procedure or treatment, try to avoid blood pressure being taken or needle sticks from occurring on the arm on the side of cancer.   This recommendation begins after surgery and continues for the rest of your life.  This may help reduce your risk of getting lymphedema (swelling in your arm). An excellent resource for those seeking information on lymphedema is the National Lymphedema Network's web site. It can be accessed at www.lymphnet.org If you notice swelling in your hand, arm or breast at any time following surgery (even if it is many years from now), please contact your doctor or physical therapist to discuss this.  Lymphedema can be treated at any time but it is easier for you if it is treated early on.  If you feel like your shoulder motion is not returning to normal in a reasonable amount of time, please contact your surgeon or physical therapist.  [facility] [phone]. [address], [address], [place] [address]  [facility] CLASS After Breast Cancer Class  After Breast Cancer Class  is a specially designed exercise class video to assist you in a safe recover after having breast cancer surgery.  In this video you will learn how to get back to full function whether your drains were just removed or if you had surgery a month ago. The video can be viewed on this page: [email or web address].boyd-meyer.org/ or on YouTube here: https://[email or web address].  Class Goals  Understand specific stretches to improve the flexibility of you chest and shoulder. Learn ways to safely strengthen your upper body and improve your posture. Understand the warning signs of infection and why you may be at risk for an arm infection. Learn about Lymphedema and prevention.  ** You do not need to view this video until after surgery.  Drains should be removed to participate in the recommended exercises on the video.  Patient was instructed today in a home exercise program today for post op shoulder range of motion. These included active assist shoulder flexion in sitting, scapular retraction, wall walking with shoulder abduction, and hands behind head external rotation.  She was encouraged to do these twice a day, holding 3 seconds and repeating 5 times when permitted by her physician.  Eward Wonda Sharps, [place] 04/05/24 2:47 PM

## 2024-04-05 NOTE — Research (Signed)
 Trial:  S2205, ICE COMPRESS: RANDOMIZED TRIAL OF LIMB CRYOCOMPRESSION VERSUS CONTINUOUS COMPRESSION VERSUS LOW CYCLIC COMPRESSION FOR THE PREVENTION OF TAXANE-INDUCED PERIPHERAL NEUROPATHY  Patient Angela Marshall was identified by Dr. Gudena as a potential candidate for the above listed study.  This Clinical Research Nurse met with Angela Marshall, [id number], on 04/05/24 in a manner and location that ensures patient privacy to discuss participation in the above listed research study.  Patient is Unaccompanied.  A copy of the informed consent document and separate HIPAA Authorization was provided to the patient.  Patient reads, speaks, and understands [place].   Patient was provided with the business card of this Nurse and encouraged to contact the research team with any questions.  Approximately 10 minutes were spent with the patient reviewing the informed consent documents.  Patient was provided the option of taking informed consent documents home to review and was encouraged to review at their convenience with their support network, including other care providers. Patient took the consent documents home to review. Cherylyn Hoard, BSN, RN, [employer] Research Nurse II [phone] 04/05/2024 2:57 PM

## 2024-04-05 NOTE — Research (Signed)
 Trial:  [employer] 2021-05 - Specimen Collection Study to Evaluate Biomarkers in Subjects with Cancer   Patient Angela Marshall was identified by Dr. Gudena as a potential candidate for the above listed study.  This Clinical Research Nurse met with Angela Marshall, [id number], on 04/05/24 in a manner and location that ensures patient privacy to discuss participation in the above listed research study.  Patient is Unaccompanied.  A copy of the informed consent document and separate HIPAA Authorization was provided to the patient.  Patient reads, speaks, and understands [place].   Patient was provided with the business card of this Nurse and encouraged to contact the research team with any questions.  Approximately 5 minutes were spent with the patient reviewing the informed consent documents.  Patient was provided the option of taking informed consent documents home to review and was encouraged to review at their convenience with their support network, including other care providers. Patient took the consent documents home to review. Cherylyn Hoard, BSN, RN, [employer] Research Nurse II [phone] 04/05/2024 2:58 PM

## 2024-04-05 NOTE — Progress Notes (Signed)
 [facility] Multidisciplinary Clinic Spiritual Care Note  Met with Angela Marshall in Breast Multidisciplinary Clinic to introduce Support Center team/resources.  She completed SDOH screening; results follow below.    SDOH Screenings   Food Insecurity: No Food Insecurity (04/05/2024)  Housing: Low Risk  (04/05/2024)  Transportation Needs: No Transportation Needs (04/05/2024)  Utilities: Not At Risk (04/05/2024)  Depression (PHQ2-9): Low Risk  (04/05/2024)  Tobacco Use: Medium Risk (04/05/2024)   Received from [facility] and patient discussed common feelings and emotions when being diagnosed with cancer, and the importance of support during treatment.  Chaplain informed patient of the support team and support services at [facility].  Chaplain provided contact information and encouraged patient to call with any questions or concerns.  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   Follow up needed: Yes.  We plan to follow up by phone in ca two weeks for spiritual/emotional check-in, as [facility] has brought up much to process. Angela Marshall also has direct Spiritual Care number in case needs arise in the meantime.   [address] [place], [place], [facility] Pager [phone] Voicemail [phone]

## 2024-04-05 NOTE — Assessment & Plan Note (Signed)
 03/30/2024:Screening mammogram detected right upper outer quadrant mass 2.6 cm by mammogram, by ultrasound it measured 2.2 cm at 10 o'clock position, axilla negative.  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 After reviewing the pathology in detail, we proceeded to discuss the different treatment options between surgery, radiation, chemotherapy, antiestrogen therapies.  Treatment plan: Neoadjuvant chemotherapy with Taxol carbo Keytruda followed by Adriamycin Cytoxan Keytruda followed by Sharion maintenance for 1 year Breast conserving surgery with sentinel lymph node biopsy Adjuvant radiation  Patient will also be counseled for Scarlet clinical trial Chemotherapy Counseling: I discussed the risks and benefits of chemotherapy including the risks of nausea/ vomiting, risk of infection from low WBC count, fatigue due to chemo or anemia, bruising or bleeding due to low platelets, mouth sores, loss/ change in taste and decreased appetite. Liver and kidney function will be monitored through out chemotherapy as abnormalities in liver and kidney function may be a side effect of treatment. Cardiac dysfunction due to Adriamycin and neuropathy risk from Taxol as well as immune mediated adverse effects from Keytruda were discussed in detail. Risk of permanent bone marrow dysfunction and leukemia due to chemo were also discussed.  Plan: Port, echo, chemo class, clinical trial information

## 2024-04-06 NOTE — Progress Notes (Unsigned)
 REFERRING PROVIDER: Vernetta Berg, MD [address] [place],  [place] [address]  PRIMARY PROVIDER:  Lenon Nell SAILOR, FNP  PRIMARY REASON FOR VISIT:  No diagnosis found.  HISTORY OF PRESENT ILLNESS:   Angela Marshall, a 48 y.o. female, was seen for a [facility] cancer genetics consultation at the request of Berg Vernetta, MD due to a personal history of breast cancer. Angela Marshall presents to clinic today to discuss the possibility of a hereditary predisposition to cancer, genetic testing, and to further clarify her future cancer risks, as well as potential cancer risks for family members.   ***presents today the at the [facility] to discuss the possibility of a hereditary predisposition to cancer, genetic testing, and to further clarify her future cancer risks, as well as potential cancer risks for family members.  Diagnosis: In ***, at the age of ***, Angela Marshall was diagnosed with {CA PATHOLOGY:63853} of the {right left (wildcard):15202} {CA NMHJW:36145}. The treatment plan includes***.    *** Angela Marshall is a 48 y.o. female with no personal history of cancer.    CANCER HISTORY:  Oncology History  Malignant neoplasm of upper-outer quadrant of right breast in female, estrogen receptor positive (HCC)  03/30/2024 Initial Diagnosis   Screening mammogram detected right upper outer quadrant mass 2.6 cm by mammogram, by ultrasound it measured 2.2 cm at 10 o'clock position, axilla negative.  Left breast: Cysts: Aspirated Biopsy: Grade 3 IDC ER 2%, PR 0%, Ki67 60%, HER2 negative   04/05/2024 Cancer Staging   Staging form: Breast, AJCC 8th Edition - Clinical: Stage IIB (cT2, cN0, cM0, G3, ER-, PR-, HER2-) - Signed by Odean Potts, MD on 04/05/2024 Stage prefix: Initial diagnosis Histologic grading system: 3 grade system     RISK FACTORS:  Menarche was at age ***.  First live birth at age ***.  OCP use for approximately {Numbers 1-12 multi-select:20307}  years.  Ovaries intact: {Yes/No-Ex:120004}.  Hysterectomy: {Yes/No-Ex:120004}.  Menopausal status: {Menopause:31378}.  HRT use: {Numbers 1-12 multi-select:20307} years. Colonoscopy: {Yes/No-Ex:120004}; {normal/abnormal/not examined:14677}. Mammogram within the last year: {Yes/No-Ex:120004}. Number of breast biopsies: {Numbers 1-12 multi-select:20307}. Up to date with pelvic exams: {Yes/No-Ex:120004}. Any excessive radiation exposure in the past: {Yes/No-Ex:120004} Tobacco Use: ***Current***Former***Never Past Medical History:  Diagnosis Date   Breast cancer (HCC)    Family history of brain cancer    Father at 26   Family history of breast cancer    maternal grandmother   Family history of ovarian cancer    Paternal 1st Cousin at 54    No past surgical history on file.  Social History   Socioeconomic History   Marital status: Legally Separated    Spouse name: Not on file   Number of children: Not on file   Years of education: Not on file   Highest education level: Not on file  Occupational History   Not on file  Tobacco Use   Smoking status: Not on file   Smokeless tobacco: Not on file  Substance and Sexual Activity   Alcohol use: Not on file   Drug use: Not on file   Sexual activity: Not on file  Other Topics Concern   Not on file  Social History Narrative   Not on file   Social Drivers of Health   Financial Resource Strain: Not on file  Food Insecurity: No Food Insecurity (04/05/2024)   Hunger Vital Sign    Worried About Running Out of Food in the Last Year: Never true  Ran Out of Food in the Last Year: Never true  Transportation Needs: No Transportation Needs (04/05/2024)   PRAPARE - Administrator, Civil Service (Medical): No    Lack of Transportation (Non-Medical): No  Physical Activity: Not on file  Stress: Not on file  Social Connections: Not on file     FAMILY HISTORY:  We obtained a detailed, 4-generation family history.  Significant  diagnoses are listed below: No family history on file.  Pedigree Summary:  Angela Marshall has a family history of  Father - Brain Cancer at 63 Paternal 1st cousin - ovarian dancer at 12 Maternal grandmother- breast cancer at 16 Angela Marshall is ***aware ***unaware of relatives completing genetic testing for hereditary cancer risks.  ***Patient's maternal ancestors are of *** descent, and paternal ancestors are of *** descent.  There {IS NO:12509} reported Ashkenazi Jewish ancestry.  There ***is no known consanguinity.  GENETIC COUNSELING ASSESSMENT: Angela Marshall is a 48 y.o. female with a {Personal/family:20331} history of {cancer/polyps} which is somewhat suggestive of a hereditary cancer predisposition syndrome*** given ***. We, therefore, discussed and recommended the following at today's visit.   DISCUSSION: We discussed that, in general, most cancer is not inherited in families, but instead is sporadic or familial. Sporadic cancers occur by chance and typically happen at older ages (>50 years) as this type of cancer is caused by genetic changes acquired during an individual's lifetime. Some families have more cancers than would be expected by chance; however, the ages or types of cancer are not consistent with a known genetic mutation or known genetic mutations have been ruled out. This type of familial cancer is thought to be due to a combination of multiple genetic, environmental, hormonal, and lifestyle factors. While this combination of factors likely increases the risk of cancer, the exact source of this risk is not currently identifiable or testable.  We discussed that 5-10% of cancer is the result of germline (heritable) genetic variants, with most cases associated with ***BRCA1/BRCA2.  There are other genes that can be associated with hereditary *** cancer syndromes.  These include ***.  We discussed that testing is beneficial for several reasons including knowing how to follow individuals  after completing their treatment, identifying whether potential treatment options such as PARP inhibitors would be beneficial, and understanding if other family members could be at risk for cancer and allow them to undergo genetic testing.   We reviewed the characteristics, features and inheritance patterns of hereditary cancer syndromes. We also discussed genetic testing, including the appropriate family members to test, the process of testing, insurance coverage and turn-around-time for results. We discussed the implications of a negative, positive, carrier and/or variant of uncertain significant result. Angela Marshall  was offered a common hereditary cancer panel (***40 ***48 genes) and an expanded pan-cancer panel (***70***76 genes). Angela Marshall was informed of the benefits and limitations of each panel, including that expanded pan-cancer panels contain genes that do not have clear management guidelines at this point in time.  We also discussed that as the number of genes included on a panel increases, the chances of variants of uncertain significance increases.  GENETIC TESTING CONSENT:  After considering the risks, benefits, and limitations, Angela Marshall ***did NOT provide***provided informed consent to pursue genetic testing. A blood sample was sent to [facility] for analysis of the ***CancerNext-Expanded+RNA Panel. Results should be available within approximately ***2-3 weeks' time, at which point they will be disclosed by telephone to Angela Marshall , as will any  additional recommendations warranted by these results. Angela Marshall will receive a summary of her genetic counseling visit and a copy of her results once available. This information will also be available in Epic.  ***{INSERT}.kppinvtae ***{INSERT} .kppambry ***GENETIC TESTING NATIONAL CRITERIA: Based on Angela Marshall's {Personal/family:20331} history of cancer she meets medical criteria for genetic testing based on the [employer] (NCCN) guidelines. ***Though Angela Marshall is not personally affected, there are no affected family members that are willing/able/available to undergo hereditary cancer testing. Therefore, Angela Marshall the most informative family member available. ***Despite that she meets criteria, she may still have an out of pocket cost. We discussed that if her out of pocket cost for testing is over $100, the laboratory will call and confirm whether she wants to proceed with testing.  If the out of pocket cost of testing is less than $100 she will be billed by the genetic testing laboratory.   ***DOES NOT MEET NCCN CRITERIA ***We discussed with Angela Marshall that the {Personal/family:20331} history does not meet insurance or NCCN criteria for genetic testing and, therefore, is not highly consistent with a familial hereditary cancer syndrome.  We feel she is at low risk to harbor  a gene mutation associated with such a condition. Thus, we did not recommend any genetic testing, at this time, and recommended Angela Marshall continue to follow the cancer screening guidelines given by her primary healthcare provider.  ***STAT TESTING ***We reviewed the characteristics, features and inheritance patterns of hereditary cancer syndromes. We also discussed genetic testing, including the appropriate family members to test, the process of testing, insurance coverage and turn-around-time for results. We discussed the implications of a negative, positive and/or variant of uncertain significant result. In order to get genetic test results in a timely manner so that Ms. Chuck can use these genetic test results for surgical decisions, we recommended Angela Marshall pursue genetic testing for the ***. Once complete, we recommend Angela Marshall pursue reflex genetic testing to the *** gene panel.   GENETIC INFORMATION NONDISCRIMINATION ACT (GINA): We discussed that some people do not want to undergo genetic testing due to fear of genetic  discrimination.  The Genetic Information Nondiscrimination Act (GINA) was signed into federal law in 2008. GINA prohibits health insurers and most employers from discriminating against individuals based on genetic information (including the results of genetic tests and family history information). According to GINA, health insurance companies cannot consider genetic information to be a preexisting condition, nor can they use it to make decisions regarding coverage or rates. GINA also makes it illegal for most employers to use genetic information in making decisions about hiring, firing, promotion, or terms of employment. It is important to note that GINA does not offer protections for life insurance, disability insurance, or long-term care insurance. GINA does not apply to those in the [employer], those who work for companies with less than 15 employees, and new life insurance or long-term disability insurance policies.  Health status due to a cancer diagnosis is not protected under GINA. More information about GINA can be found by visiting [email or web address].  ***Statistical Models ***In order to estimate her chance of having a {CA GENE:62345} mutation, we used statistical models ({GENMODELS:62370}) that consider her personal medical history, family history and ancestry.  Because each model is different, there can be a lot of variability in the risks they give.  Therefore, these numbers must be considered a rough range and not a precise risk of having a {CA GENE:62345} mutation.  These  models estimate that she has approximately a ***-***% chance of having a mutation. Based on this assessment of her family and personal history, genetic testing {IS/ISNOT:34056} recommended.  ***The Tyrer-Cuzick model is one of multiple prediction models developed to estimate an individual's lifetime risk of developing breast cancer. The Tyrer-Cuzick model is endorsed by the [employer] (NCCN). This model  includes many risk factors such as family history, endogenous estrogen exposure, and benign breast disease. The calculation is highly-dependent on the accuracy of clinical data provided by the patient and can change over time. The Tyrer-Cuzick model may be repeated to reflect new information in her personal or family history in the future.   ***Based on the patient's {Personal/family:20331} history, a statistical model ({GENMODELS:62370}) was used to estimate her risk of developing {CA HX:54794}. This estimates her lifetime risk of developing {CA HX:54794} to be approximately ***%. This estimation does not consider any genetic testing results.  The patient's lifetime breast cancer risk is a preliminary estimate based on available information using one of several models endorsed by the American Cancer Society (ACS). The ACS recommends consideration of breast MRI screening as an adjunct to mammography for patients at high risk (defined as 20% or greater lifetime risk). Please note that a woman's breast cancer risk changes over time. It may increase or decrease based on age and any changes to the personal and/or family medical history. The risks and recommendations listed above apply to this patient at this point in time. In the future, she may or may not be eligible for the same medical management strategies and, in some cases, other medical management strategies may become available to her. If she is interested in an updated breast cancer risk assessment at a later date, she can contact us .  ***Ms. Romaniello has been determined to be at high risk for breast cancer.  her Tyrer-Cuzick risk score is ***%.  For women with a greater than 20% lifetime risk of breast cancer, the [employer] (NCCN) recommends the following:  1.      Clinical encounter every 6-12 months to begin when identified as being at increased risk, but not before age 16  2.      Annual mammograms. Tomosynthesis is  recommended starting 10 years earlier than the youngest breast cancer diagnosis in the family or at age 3 (whichever comes first), but not before age 1   68.      Annual breast MRI starting 10 years earlier than the youngest breast cancer diagnosis in the family or at age 75 (whichever comes first), but not before age 43.    Lastly, we encouraged Ms. Rubiano to remain in contact with cancer genetics annually so that we can continuously update the family history and inform her of any changes in cancer genetics and testing that may be of benefit for this family.   Ms. Laufer questions were answered to her satisfaction today. Our contact information was provided should additional questions or concerns arise. Thank you for the referral and allowing us  to share in the care of your patient.   Resources:  Ms. Lacek was provided with the following:  ***[facility] Billing information  ***[facility] Hereditary Cancer Testing Patient Guide PLAN:  ***Testing Ordered: ***Clinic Note Faxed/Routed to Ms. Boody's PCP ***Lenon Nell SAILOR, FNP  ***Declined Testing *** Despite our recommendation, Ms. Wonnacott did not wish to pursue genetic testing at today's visit. We understand this decision and remain available to coordinate genetic testing at any  time in the future. We, therefore, recommend Ms. Thane continue to follow the cancer screening guidelines given by her primary healthcare provider.  ***MOST INFORMATIVE PERSON TO TEST ***Based on Ms. Shoults's family history, we recommended her ***, who was diagnosed with *** at age ***, have genetic counseling and testing. Ms. Spallone will let us  know if we can be of any assistance in coordinating genetic counseling and/or testing for this family member.   ***Signature   I personally spent a total of *** minutes in the care of the patient today including {Time Based Coding:210964241}.  *** The patient was seen alone.  ***The patient brought ***. Drs.  Lanny Stalls, and/or Gudena were available for questions, if needed.  _______________________________________________________________________ For Office Staff:  Number of people involved in session: *** Was an Intern/ student involved with case: {YES/NO:63}

## 2024-04-07 ENCOUNTER — Encounter: Payer: Self-pay | Admitting: Family

## 2024-04-07 DIAGNOSIS — Z8041 Family history of malignant neoplasm of ovary: Secondary | ICD-10-CM | POA: Insufficient documentation

## 2024-04-07 DIAGNOSIS — Z808 Family history of malignant neoplasm of other organs or systems: Secondary | ICD-10-CM | POA: Insufficient documentation

## 2024-04-07 DIAGNOSIS — Z803 Family history of malignant neoplasm of breast: Secondary | ICD-10-CM | POA: Insufficient documentation

## 2024-04-07 DIAGNOSIS — C50919 Malignant neoplasm of unspecified site of unspecified female breast: Secondary | ICD-10-CM | POA: Insufficient documentation

## 2024-04-10 ENCOUNTER — Other Ambulatory Visit: Payer: Self-pay

## 2024-04-10 ENCOUNTER — Telehealth: Payer: Self-pay | Admitting: *Deleted

## 2024-04-10 ENCOUNTER — Ambulatory Visit (HOSPITAL_COMMUNITY)
Admission: RE | Admit: 2024-04-10 | Source: Ambulatory Visit | Attending: Hematology and Oncology | Admitting: Hematology and Oncology

## 2024-04-10 ENCOUNTER — Encounter (HOSPITAL_COMMUNITY): Payer: Self-pay | Admitting: Surgery

## 2024-04-10 NOTE — Telephone Encounter (Signed)
 Received from patient that she went to the wrong place for her echo and when she arrived at the correct place they could not see her because she was 15 min late.  Was able to get her r/s for tomorrow at 11/15. Informed her to arrive at 11am and if she is even 5 minutes late they will not see her.  Patient verbalized understanding.

## 2024-04-10 NOTE — Progress Notes (Signed)
 PCP - Nell Piety, FNP Cardiologist -  none Oncology - Dr Odean  Chest x-ray - n/a EKG - DOS Stress Test - n/a ECHO - n/a but scheduled on 04/11/24 at 11:15 AM Cardiac Cath - n/a  ICD Pacemaker/Loop - n/a  Sleep Study -  n/a  Diabetes - n/a  Aspirin & Blood Thinner Instructions:  n/a  NPO  Anesthesia review: no  STOP now taking any Aspirin (unless otherwise instructed by your surgeon), Aleve, Naproxen, Ibuprofen, Motrin, Advil, Goody's, BC's, all herbal medications, fish oil, and all vitamins.   Coronavirus Screening Do you have any of the following symptoms:  Cough yes/no: No Fever (>100.61F)  yes/no: No Runny nose yes/no: No Sore throat yes/no: No Difficulty breathing/shortness of breath  yes/no: No  Have you traveled in the last 14 days and where? yes/no: No  Patient verbalized understanding of instructions that were given to them at the PAT appointment. Patient was also instructed that they will need to review over the PAT instructions again at home before surgery.

## 2024-04-10 NOTE — Telephone Encounter (Signed)
 D7787: SHORTER ANTHRACYCLINE-FREE CHEMO IMMUNOTHERAPY ADAPTED TO PATHOLOGICAL RESPONSE IN EARLY TRIPLE NEGATIVE BREAST CANCER (SCARLET), A RANDOMIZED PHASE III STUDY  Patient states interested in participating in the above study and agreed to meet with this Research nurse on Friday 04/14/24 at 10 am to discuss further and possibly consent.  Cherylyn Hoard, BSN, RN, [employer] Research Nurse II [phone] 04/10/2024 4:41 PM

## 2024-04-10 NOTE — Telephone Encounter (Signed)
 D7794, ICE COMPRESS: RANDOMIZED TRIAL OF LIMB CRYOCOMPRESSION VERSUS CONTINUOUS COMPRESSION VERSUS LOW CYCLIC COMPRESSION FOR THE PREVENTION OF TAXANE-INDUCED PERIPHERAL NEUROPATHY  Patient states interested in participating in the above study and agreed to meet with this Research nurse on Friday 04/14/24 at 10 am to discuss further and possibly consent. Cherylyn Hoard, BSN, RN, [employer] Research Nurse II [phone] 04/10/2024 4:42 PM

## 2024-04-11 ENCOUNTER — Ambulatory Visit (HOSPITAL_COMMUNITY)
Admission: RE | Admit: 2024-04-11 | Discharge: 2024-04-11 | Disposition: A | Discharge status: Home / Self Care | Source: Ambulatory Visit | Attending: Internal Medicine | Admitting: Internal Medicine

## 2024-04-11 DIAGNOSIS — Z170 Estrogen receptor positive status [ER+]: Secondary | ICD-10-CM | POA: Diagnosis present

## 2024-04-11 DIAGNOSIS — C50411 Malignant neoplasm of upper-outer quadrant of right female breast: Secondary | ICD-10-CM | POA: Diagnosis present

## 2024-04-11 LAB — ECHOCARDIOGRAM COMPLETE
Area-P 1/2: 3.19 cm2
S' Lateral: 2.7 cm

## 2024-04-11 NOTE — H&P (Signed)
 REFERRING PHYSICIAN: Gudena, Vinay K, MD PROVIDER: VICENTA DASIE POLI, MD MRN: [id number] DOB: [birth year] DATE OF ENCOUNTER: 04/05/2024 Subjective   Chief Complaint: Breast Cancer  History of Present Illness: Angela Marshall is a 48 y.o. female who is seen today as an office consultation for evaluation of Breast Cancer  This is a 48 year old female palpated mass in her right breast. She underwent imaging showing a 2.2 x 1.9 x 1.3 cm mass. A contrast-enhanced mammogram was performed showing the mass to be 2.6 cm. Axilla was negative. The mass was in the 10 o'clock position 15 cm from the nipple. She underwent a biopsy showing a grade 3 invasive ductal carcinoma. It was weakly ER +2%, PR negative, HER2 negative, and had a Ki-67 of 60%. She has had no previous problems regarding her breast. She is otherwise healthy without complaints. She has no cardiopulmonary issues. She has had no previous issues with general anesthesia  Review of Systems: A complete review of systems was obtained from the patient. I have reviewed this information and discussed as appropriate with the patient. See HPI as well for other ROS.  ROS   Medical History: Past Medical History:  Diagnosis Date  Anxiety  History of cancer  Hyperlipidemia  Hypertension   Patient Active Problem List  Diagnosis  Hyperlipidemia  Malignant neoplasm of upper-outer quadrant of right breast in female, estrogen receptor positive (CMS/HHS-HCC)   Past Surgical History:  Procedure Laterality Date  ABDOMINOPLASTY N/A  12/2022 and 11/2022    Allergies  Allergen Reactions  Penicillins Hives  Product containing penicillin (product)   Current Outpatient Medications on File Prior to Visit  Medication Sig Dispense Refill  atorvastatin (LIPITOR) 20 MG tablet Take 20 mg by mouth every evening  cetirizine (ZYRTEC) 10 MG tablet Take 10 mg by mouth once daily  hydroCHLOROthiazide (HYDRODIURIL) 25 MG tablet Take 25 mg by mouth once daily   losartan (COZAAR) 50 MG tablet Take 50 mg by mouth once daily   No current facility-administered medications on file prior to visit.   Family History  Problem Relation Age of Onset  Obesity Mother  High blood pressure (Hypertension) Mother  Hyperlipidemia (Elevated cholesterol) Mother  Coronary Artery Disease (Blocked arteries around heart) Mother  Diabetes Mother    Social History   Tobacco Use  Smoking Status Former  Types: Cigarettes  Smokeless Tobacco Never  Tobacco Comments  Quit cigarettes 6 months ago    Social History   Socioeconomic History  Marital status: Legally Separated  Tobacco Use  Smoking status: Former  Types: Cigarettes  Smokeless tobacco: Never  Tobacco comments:  Quit cigarettes 6 months ago  Vaping Use  Vaping status: Some Days  Substance and Sexual Activity  Drug use: Never   Objective:  There were no vitals filed for this visit.  There is no height or weight on file to calculate BMI.  Physical Exam   She appears well on exam  There is a palpable 2-1/2 cm mass in the upper outer quadrant of the right breast near the axilla. It is mobile. There is minimal ecchymosis from the biopsy. There is no axillary adenopathy. The nipple areolar complex is normal.  Labs, Imaging and Diagnostic Testing: I have reviewed her mammograms, ultrasound, and pathology results  Assessment and Plan:   Diagnoses and all orders for this visit:  Invasive ductal carcinoma of breast, right (CMS/HHS-HCC)   I discussed the pathology with the patient. This is likely a triple negative breast cancer. We have discussed her this  morning her multidisciplinary breast cancer comments. Neoadjuvant chemotherapy is recommended. From a breast surgical standpoint I did discuss lumpectomy with sentinel node biopsy versus mastectomy. She is eventually interested in breast conservation. For now she needs a Port-A-Cath inserted. We will try to get this scheduled as soon as  possible. I explained the procedure of Port-A-Cath placement in detail. We discussed the risk of the procedure which includes but is not limited to bleeding, infection, injury to the blood vessel or surrounding tissue, pneumothorax, the need for further procedures, port malfunction, other cardiopulmonary issues with anesthesia, postoperative recovery, excetra. She understands and wished to proceed with Port-A-Cath insertion and the plans for neoadjuvant chemotherapy

## 2024-04-12 ENCOUNTER — Ambulatory Visit (HOSPITAL_COMMUNITY)

## 2024-04-12 ENCOUNTER — Ambulatory Visit (HOSPITAL_COMMUNITY)
Admission: RE | Admit: 2024-04-12 | Discharge: 2024-04-12 | Disposition: A | Discharge status: Home / Self Care | Attending: Surgery | Admitting: Surgery

## 2024-04-12 ENCOUNTER — Other Ambulatory Visit: Payer: Self-pay

## 2024-04-12 ENCOUNTER — Ambulatory Visit (HOSPITAL_COMMUNITY): Admitting: Anesthesiology

## 2024-04-12 ENCOUNTER — Encounter (HOSPITAL_COMMUNITY): Payer: Self-pay | Admitting: Surgery

## 2024-04-12 ENCOUNTER — Other Ambulatory Visit: Payer: Self-pay | Admitting: *Deleted

## 2024-04-12 ENCOUNTER — Encounter (HOSPITAL_COMMUNITY)
Admission: RE | Disposition: A | Discharge status: Home / Self Care | Payer: Self-pay | Source: Home / Self Care | Attending: Surgery

## 2024-04-12 DIAGNOSIS — Z170 Estrogen receptor positive status [ER+]: Secondary | ICD-10-CM | POA: Diagnosis not present

## 2024-04-12 DIAGNOSIS — I10 Essential (primary) hypertension: Secondary | ICD-10-CM | POA: Diagnosis not present

## 2024-04-12 DIAGNOSIS — Z1722 Progesterone receptor negative status: Secondary | ICD-10-CM | POA: Diagnosis not present

## 2024-04-12 DIAGNOSIS — Z452 Encounter for adjustment and management of vascular access device: Secondary | ICD-10-CM | POA: Insufficient documentation

## 2024-04-12 DIAGNOSIS — Z87891 Personal history of nicotine dependence: Secondary | ICD-10-CM | POA: Insufficient documentation

## 2024-04-12 DIAGNOSIS — C50411 Malignant neoplasm of upper-outer quadrant of right female breast: Secondary | ICD-10-CM | POA: Diagnosis not present

## 2024-04-12 DIAGNOSIS — Z1732 Human epidermal growth factor receptor 2 negative status: Secondary | ICD-10-CM | POA: Insufficient documentation

## 2024-04-12 HISTORY — DX: Essential (primary) hypertension: I10

## 2024-04-12 HISTORY — DX: Anxiety disorder, unspecified: F41.9

## 2024-04-12 HISTORY — PX: PORTACATH PLACEMENT: SHX2246

## 2024-04-12 HISTORY — DX: Other seasonal allergic rhinitis: J30.2

## 2024-04-12 HISTORY — DX: Hyperlipidemia, unspecified: E78.5

## 2024-04-12 SURGERY — INSERTION, TUNNELED CENTRAL VENOUS DEVICE, WITH PORT
Anesthesia: General

## 2024-04-12 MED ORDER — LACTATED RINGERS IV SOLN: INTRAVENOUS | Status: DC

## 2024-04-12 MED ORDER — FENTANYL CITRATE (PF) 100 MCG/2ML IJ SOLN: 25.0000 ug | INTRAMUSCULAR | Status: DC | PRN

## 2024-04-12 MED ORDER — CHLORHEXIDINE GLUCONATE 0.12 % MT SOLN
15.0000 mL | Freq: Once | OROMUCOSAL | Status: AC
  Administered 2024-04-12: 15 mL via OROMUCOSAL
  Filled 2024-04-12: qty 15

## 2024-04-12 MED ORDER — CHLORHEXIDINE GLUCONATE CLOTH 2 % EX PADS: 6.0000 | MEDICATED_PAD | Freq: Once | CUTANEOUS | Status: DC

## 2024-04-12 MED ORDER — ACETAMINOPHEN 500 MG PO TABS
1000.0000 mg | ORAL_TABLET | ORAL | Status: AC
  Administered 2024-04-12: 1000 mg via ORAL
  Filled 2024-04-12: qty 2

## 2024-04-12 MED ORDER — BUPIVACAINE HCL (PF) 0.25 % IJ SOLN
INTRAMUSCULAR | Status: AC
  Filled 2024-04-12: qty 30

## 2024-04-12 MED ORDER — BUPIVACAINE-EPINEPHRINE (PF) 0.25% -1:200000 IJ SOLN
INTRAMUSCULAR | Status: AC
  Filled 2024-04-12: qty 30

## 2024-04-12 MED ORDER — AMISULPRIDE (ANTIEMETIC) 5 MG/2ML IV SOLN: 10.0000 mg | Freq: Once | INTRAVENOUS | Status: DC | PRN

## 2024-04-12 MED ORDER — HEPARIN SOD (PORK) LOCK FLUSH 100 UNIT/ML IV SOLN
INTRAVENOUS | Status: AC
  Filled 2024-04-12: qty 5

## 2024-04-12 MED ORDER — LIDOCAINE 2% (20 MG/ML) 5 ML SYRINGE
INTRAMUSCULAR | Status: DC | PRN
  Administered 2024-04-12: 60 mg via INTRAVENOUS

## 2024-04-12 MED ORDER — HEPARIN SOD (PORK) LOCK FLUSH 100 UNIT/ML IV SOLN
INTRAVENOUS | Status: DC | PRN
  Administered 2024-04-12: 500 [IU] via INTRAVENOUS

## 2024-04-12 MED ORDER — ONDANSETRON HCL 4 MG/2ML IJ SOLN
INTRAMUSCULAR | Status: DC | PRN
Start: 2024-04-12 — End: 2024-04-12
  Administered 2024-04-12: 4 mg via INTRAVENOUS

## 2024-04-12 MED ORDER — PROPOFOL 10 MG/ML IV BOLUS
INTRAVENOUS | Status: AC
  Filled 2024-04-12: qty 20

## 2024-04-12 MED ORDER — MIDAZOLAM HCL 2 MG/2ML IJ SOLN
INTRAMUSCULAR | Status: AC
  Filled 2024-04-12: qty 2

## 2024-04-12 MED ORDER — LIDOCAINE HCL (PF) 1 % IJ SOLN
INTRAMUSCULAR | Status: AC
  Filled 2024-04-12: qty 30

## 2024-04-12 MED ORDER — LIDOCAINE 2% (20 MG/ML) 5 ML SYRINGE
INTRAMUSCULAR | Status: AC
  Filled 2024-04-12: qty 5

## 2024-04-12 MED ORDER — BUPIVACAINE HCL (PF) 0.25 % IJ SOLN
INTRAMUSCULAR | Status: DC | PRN
  Administered 2024-04-12: 10 mL

## 2024-04-12 MED ORDER — PROPOFOL 10 MG/ML IV BOLUS
INTRAVENOUS | Status: AC
Start: 2024-04-12 — End: 2024-04-12
  Filled 2024-04-12: qty 20

## 2024-04-12 MED ORDER — ORAL CARE MOUTH RINSE: 15.0000 mL | Freq: Once | OROMUCOSAL | Status: AC

## 2024-04-12 MED ORDER — FENTANYL CITRATE (PF) 100 MCG/2ML IJ SOLN
INTRAMUSCULAR | Status: AC
  Filled 2024-04-12: qty 2

## 2024-04-12 MED ORDER — HEPARIN 6000 UNIT IRRIGATION SOLUTION
Status: DC | PRN
  Administered 2024-04-12: 1

## 2024-04-12 MED ORDER — HEPARIN 6000 UNIT IRRIGATION SOLUTION
Status: AC
  Filled 2024-04-12: qty 500

## 2024-04-12 MED ORDER — FENTANYL CITRATE (PF) 100 MCG/2ML IJ SOLN
INTRAMUSCULAR | Status: DC | PRN
  Administered 2024-04-12: 100 ug via INTRAVENOUS

## 2024-04-12 MED ORDER — ONDANSETRON HCL 4 MG/2ML IJ SOLN
INTRAMUSCULAR | Status: AC
  Filled 2024-04-12: qty 2

## 2024-04-12 MED ORDER — CIPROFLOXACIN IN D5W 400 MG/200ML IV SOLN
400.0000 mg | INTRAVENOUS | Status: AC
  Administered 2024-04-12: 400 mg via INTRAVENOUS
  Filled 2024-04-12: qty 200

## 2024-04-12 MED ORDER — MIDAZOLAM HCL 2 MG/2ML IJ SOLN
INTRAMUSCULAR | Status: DC | PRN
  Administered 2024-04-12: 2 mg via INTRAVENOUS

## 2024-04-12 MED ORDER — PROPOFOL 10 MG/ML IV BOLUS
INTRAVENOUS | Status: DC | PRN
  Administered 2024-04-12: 200 mg via INTRAVENOUS
  Administered 2024-04-12: 100 mg via INTRAVENOUS

## 2024-04-12 MED ORDER — TRAMADOL HCL 50 MG PO TABS: 50.0000 mg | ORAL_TABLET | Freq: Four times a day (QID) | ORAL | 0 refills | Status: DC | PRN

## 2024-04-12 MED ORDER — DEXAMETHASONE SOD PHOSPHATE PF 10 MG/ML IJ SOLN
INTRAMUSCULAR | Status: DC | PRN
  Administered 2024-04-12: 10 mg via INTRAVENOUS

## 2024-04-12 SURGICAL SUPPLY — 37 items
BAG COUNTER SPONGE SURGICOUNT (BAG) ×2 IMPLANT
BAG DECANTER FOR FLEXI CONT (MISCELLANEOUS) ×2 IMPLANT
CHLORAPREP W/TINT 26 (MISCELLANEOUS) ×2 IMPLANT
COVER SURGICAL LIGHT HANDLE (MISCELLANEOUS) ×2 IMPLANT
COVER TRANSDUCER ULTRASND GEL (DISPOSABLE) IMPLANT
DERMABOND ADVANCED .7 DNX12 (GAUZE/BANDAGES/DRESSINGS) ×2 IMPLANT
DRAPE C-ARM 42X120 X-RAY (DRAPES) ×2 IMPLANT
DRAPE CHEST BREAST 15X10 FENES (DRAPES) ×2 IMPLANT
DRSG TEGADERM 4X4.75 (GAUZE/BANDAGES/DRESSINGS) ×2 IMPLANT
ELECT CAUTERY BLADE 6.4 (BLADE) ×2 IMPLANT
ELECTRODE REM PT RTRN 9FT ADLT (ELECTROSURGICAL) ×2 IMPLANT
GAUZE 4X4 16PLY ~~LOC~~+RFID DBL (SPONGE) ×2 IMPLANT
GAUZE SPONGE 2X2 8PLY STRL LF (GAUZE/BANDAGES/DRESSINGS) ×2 IMPLANT
GEL ULTRASOUND 20GR AQUASONIC (MISCELLANEOUS) IMPLANT
GLOVE SURG SIGNA 7.5 PF LTX (GLOVE) ×2 IMPLANT
GOWN STRL REUS W/ TWL LRG LVL3 (GOWN DISPOSABLE) ×2 IMPLANT
GOWN STRL REUS W/ TWL XL LVL3 (GOWN DISPOSABLE) ×2 IMPLANT
KIT BASIN OR (CUSTOM PROCEDURE TRAY) ×2 IMPLANT
KIT PORT POWER 8FR ISP CVUE (Port) IMPLANT
KIT TURNOVER KIT B (KITS) ×2 IMPLANT
PAD ARMBOARD POSITIONER FOAM (MISCELLANEOUS) ×2 IMPLANT
PENCIL BUTTON HOLSTER BLD 10FT (ELECTRODE) ×2 IMPLANT
POSITIONER HEAD DONUT 9IN (MISCELLANEOUS) ×2 IMPLANT
SET INTRODUCER 12FR PACEMAKER (INTRODUCER) IMPLANT
SET SHEATH INTRO PEEL-AWY 10FR (MISCELLANEOUS) IMPLANT
SET SHEATH INTRO PEEL-AWY 11FR (CATHETERS) IMPLANT
SHEATH COOK PEEL AWAY SET 9F (SHEATH) IMPLANT
SOLN 0.9% NACL 1000 ML (IV SOLUTION) ×1 IMPLANT
SOLN 0.9% NACL POUR BTL 1000ML (IV SOLUTION) ×2 IMPLANT
SUT MNCRL AB 4-0 PS2 18 (SUTURE) ×2 IMPLANT
SUT PROLENE 2 0 SH 30 (SUTURE) ×2 IMPLANT
SUT SILK 2-0 18XBRD TIE 12 (SUTURE) IMPLANT
SUT VIC AB 3-0 SH 27XBRD (SUTURE) ×2 IMPLANT
SYR 5ML LUER SLIP (SYRINGE) ×2 IMPLANT
TOWEL GREEN STERILE (TOWEL DISPOSABLE) ×2 IMPLANT
TOWEL GREEN STERILE FF (TOWEL DISPOSABLE) ×2 IMPLANT
TRAY LAPAROSCOPIC MC (CUSTOM PROCEDURE TRAY) ×2 IMPLANT

## 2024-04-12 NOTE — Anesthesia Procedure Notes (Signed)
 Procedure Name: LMA Insertion Date/Time: 04/12/2024 8:29 AM  Performed by: Genny Gun, CRNAPre-anesthesia Checklist: Patient identified, Patient being monitored, Timeout performed, Emergency Drugs available and Suction available Patient Re-evaluated:Patient Re-evaluated prior to induction Oxygen Delivery Method: Circle system utilized Preoxygenation: Pre-oxygenation with 100% oxygen Induction Type: IV induction LMA: LMA inserted LMA Size: 4.0 Tube type: Oral Number of attempts: 1 Placement Confirmation: positive ETCO2 and breath sounds checked- equal and bilateral Tube secured with: Tape Dental Injury: Teeth and Oropharynx as per pre-operative assessment

## 2024-04-12 NOTE — Discharge Instructions (Signed)
 You may shower starting tomorrow  Tylenol and ice pack also for pain  No vigorous activity for 1 week

## 2024-04-12 NOTE — Transfer of Care (Signed)
 Immediate Anesthesia Transfer of Care Note  Patient: Angela Marshall  Procedure(s) Performed: INSERTION, TUNNELED CENTRAL VENOUS DEVICE, WITH PORT  Patient Location: PACU  Anesthesia Type:General  Level of Consciousness: oriented, drowsy, and patient cooperative  Airway & Oxygen Therapy: Patient Spontanous Breathing and Patient connected to face mask oxygen  Post-op Assessment: Report given to RN and Post -op Vital signs reviewed and stable  Post vital signs: Reviewed and stable  Last Vitals:  Vitals Value Taken Time  BP    Temp    Pulse 74 04/12/24 09:12  Resp 12 04/12/24 09:12  SpO2 99 % 04/12/24 09:12  Vitals shown include unfiled device data.  Last Pain:  Vitals:   04/12/24 0703  TempSrc:   PainSc: 0-No pain         Complications: No notable events documented.

## 2024-04-12 NOTE — Anesthesia Preprocedure Evaluation (Signed)
 Anesthesia Evaluation  Patient identified by MRN, date of birth, ID band Patient awake    Reviewed: Allergy & Precautions, NPO status , Patient's Chart, lab work & pertinent test results  Airway Mallampati: II  TM Distance: >3 FB Neck ROM: Full    Dental  (+) Dental Advisory Given   Pulmonary former smoker   breath sounds clear to auscultation       Cardiovascular hypertension, Pt. on medications  Rhythm:Regular Rate:Normal     Neuro/Psych negative neurological ROS     GI/Hepatic negative GI ROS, Neg liver ROS,,,  Endo/Other  negative endocrine ROS    Renal/GU Renal InsufficiencyRenal disease     Musculoskeletal   Abdominal   Peds  Hematology negative hematology ROS (+)   Anesthesia Other Findings   Reproductive/Obstetrics                              Anesthesia Physical Anesthesia Plan  ASA: 2  Anesthesia Plan: General   Post-op Pain Management: Tylenol PO (pre-op)* and Toradol IV (intra-op)*   Induction: Intravenous  PONV Risk Score and Plan: 3 and Dexamethasone, Ondansetron, Midazolam and Treatment may vary due to age or medical condition  Airway Management Planned: LMA  Additional Equipment:   Intra-op Plan:   Post-operative Plan: Extubation in OR  Informed Consent: I have reviewed the patients History and Physical, chart, labs and discussed the procedure including the risks, benefits and alternatives for the proposed anesthesia with the patient or authorized representative who has indicated his/her understanding and acceptance.     Dental advisory given  Plan Discussed with: CRNA  Anesthesia Plan Comments:         Anesthesia Quick Evaluation

## 2024-04-12 NOTE — Interval H&P Note (Signed)
 History and Physical Interval Note:no change in H and P  04/12/2024 7:00 AM  Angela Marshall  has presented today for surgery, with the diagnosis of BREAST CANCER, NEED FOR CHEMOTHERAPY.  The various methods of treatment have been discussed with the patient and family. After consideration of risks, benefits and other options for treatment, the patient has consented to  Procedure(s) with comments: INSERTION, TUNNELED CENTRAL VENOUS DEVICE, WITH PORT (N/A) - PORT PLACEMENT WITH ULTRASOUND GUIDANCE as a surgical intervention.  The patient's history has been reviewed, patient examined, no change in status, stable for surgery.  I have reviewed the patient's chart and labs.  Questions were answered to the patient's satisfaction.     Vicenta Poli

## 2024-04-12 NOTE — Op Note (Signed)
   Angela Marshall 04/12/2024   Pre-op Diagnosis: BREAST CANCER, NEED FOR CHEMOTHERAPY     Post-op Diagnosis: same  Procedure(s): LEFT SUBCLAVIAN VEIN PORT-A-CATH INSERTION (8 FR)  Surgeon(s): Vernetta Berg, MD  Anesthesia: General  Staff:  Circulator: Prentiss Shona NOVAK, RN Scrub Person: Shona No T, RN  Estimated Blood Loss: Minimal               Indications: This is a 48 year old female with a recently diagnosed right breast cancer.  After discussion with surgery, medical oncology, and radiation oncology, the decision was made to proceed with Port-A-Cath insertion for neoadjuvant chemotherapy  Procedure: The patient was brought to the operating and identified the correct patient.  She was placed upon on the operating table and general anesthesia was induced.  Her left neck and chest were then prepped and draped in usual sterile fashion.  The patient was placed in the Trendelenburg position.  I anesthetized the left side of the chest and clavicle with Marcaine.  I then easily cannulated the left subclavian vein with a introducer needle.  A wire was passed through the needle into the central venous system under fluoroscopy.  I then made a incision at the wire introduction site with a scalpel after anesthetizing skin further.  I then created a pocket for the Port-A-Cath using the electrocautery.  An 8 French [place] port had already been flushed along with the catheter.  The port fit easily into the pocket.  I next passed the venous dilator introducer sheath over the wire easily and into the central venous system.  I then removed the wire and the dilator.  The catheter was attached to the port and cut in appropriate length.  I placed the port into the pocket and then fed the catheter down the peel-away sheath.  The sheath was then peeled away leaving the cath in the central venous system.  I accessed the port and good flush and return redemonstrated.  Fluoroscopy was performed  demonstrating the port appeared to be in the superior vena cava.  I sutured the port to the chest wall with a single 2-0 Prolene suture.  I then instilled the port catheter with concentrated heparin solution.  I then closed subcutaneous tissue with interrupted 3-0 Vicryl sutures and closed the skin with a running 4-0 Monocryl.  Dermabond was then applied.  The patient tolerated the procedure well.  All the counts were correct at the end of the procedure.  The patient was then extubated in the operating taken in stable addition to the recovery room.          Berg Vernetta   Date: 04/12/2024  Time: 9:05 AM

## 2024-04-13 ENCOUNTER — Encounter: Payer: Self-pay | Admitting: *Deleted

## 2024-04-13 ENCOUNTER — Telehealth: Payer: Self-pay | Admitting: *Deleted

## 2024-04-13 ENCOUNTER — Encounter (HOSPITAL_COMMUNITY): Payer: Self-pay | Admitting: Surgery

## 2024-04-13 NOTE — Telephone Encounter (Signed)
 D7787: SHORTER ANTHRACYCLINE-FREE CHEMO IMMUNOTHERAPY ADAPTED TO PATHOLOGICAL RESPONSE IN EARLY TRIPLE NEGATIVE BREAST CANCER (SCARLET), A RANDOMIZED PHASE III STUDY  Called patient to ask if she can come in earlier tomorrow morning to allow time for Cortisol and ACTH labs to be drawn after consent. Reminded pt these labs are required for the study prior to registration.  They cannot be drawn until after consent.  Patient informed research nurse that the patient education nurse just called to reschedule the education class from tomorrow to Tuesday because they do not know what patient's treatment plan will be yet. Informed patient if she consents and we get the needed lab work tomorrow, then we can get her registered to the study on Monday and will know the plan by Monday. Patient stated she does not want to wait and delay her treatment any longer by enrolling on the study. She is also concerned she will have a co-pay for the lab needed for the study and possibly other extra co-pays as a result of participating in the study. Discussed with patient that the study does not pay for of any of the treatment or visits. Everything will be billed as usual care even if she is on the study.  Patient would need to check with her insurance company to see what the co-pay would be for the lab appointment and other visits. The study follow up period includes MD visits every 6 months for 5 years and this may be more visits than she would otherwise have in the follow up period if she were not on the study.  Although, if patient is randomized to the Group 2 of study she would have less treatment and fewer visits during the treatment phase of the study, but we cannot know which arm of the study she would be randomized.  After discussion, patient feels she wants to pursue the standard treatment and not participate in the study due to reasons above.  Dr. Gudena notified and asked to enter treatment plan orders since patient has made  decision today. Thanked patient for her time and willingness to consider this study.  She verbalized understanding.  Cherylyn Hoard, BSN, RN, [employer] Research Nurse II [phone] 04/13/2024

## 2024-04-13 NOTE — Telephone Encounter (Signed)
 Left message for a return phone call to follow up from [facility] 10/8

## 2024-04-13 NOTE — Telephone Encounter (Signed)
 D7794, ICE COMPRESS: RANDOMIZED TRIAL OF LIMB CRYOCOMPRESSION VERSUS CONTINUOUS COMPRESSION VERSUS LOW CYCLIC COMPRESSION FOR THE PREVENTION OF TAXANE-INDUCED PERIPHERAL NEUROPATHY  Patient's chemo education class is rescheduled to Tuesday 10/26 and pt requests that the research visit be rescheduled to this date as well. She agrees to meet with research nurse after education class to review and possibly consent for the above study. Encouraged patient to read the consent form prior to this visit if she has time and feel free to call research nurse if any questions before our appointment. She verbalized understanding.  Cherylyn Hoard, BSN, RN, [employer] Research Nurse II [phone] 04/13/2024

## 2024-04-13 NOTE — Telephone Encounter (Signed)
 [employer] 2021-05 - Specimen Collection Study to Evaluate Biomarkers in Subjects with Cancer   Patient's chemo education class is rescheduled to Tuesday 10/26 and pt requests that the research visit be rescheduled to this date as well. She agrees to meet with research nurse after education class to review and possibly consent for the above study. Encouraged patient to read the consent form prior to this visit if she has time and feel free to call research nurse if any questions before our appointment. She verbalized understanding.  Cherylyn Hoard, BSN, RN, [employer] Research Nurse II [phone] 04/13/2024

## 2024-04-13 NOTE — Anesthesia Postprocedure Evaluation (Signed)
 Anesthesia Post Note  Patient: Angela Marshall  Procedure(s) Performed: INSERTION, TUNNELED CENTRAL VENOUS DEVICE, WITH PORT     Patient location during evaluation: PACU Anesthesia Type: General Level of consciousness: awake and alert Pain management: pain level controlled Vital Signs Assessment: post-procedure vital signs reviewed and stable Respiratory status: spontaneous breathing, nonlabored ventilation, respiratory function stable and patient connected to nasal cannula oxygen Cardiovascular status: blood pressure returned to baseline and stable Postop Assessment: no apparent nausea or vomiting Anesthetic complications: no   No notable events documented.  Last Vitals:  Vitals:   04/12/24 0930 04/12/24 0940  BP: (!) 138/95 (!) 149/99  Pulse: 64 66  Resp: 19 13  Temp:  36.6 C  SpO2: 100% 100%    Last Pain:  Vitals:   04/12/24 0930  TempSrc:   PainSc: 0-No pain                 Epifanio Lamar BRAVO

## 2024-04-14 ENCOUNTER — Other Ambulatory Visit

## 2024-04-14 ENCOUNTER — Inpatient Hospital Stay: Admitting: *Deleted

## 2024-04-14 ENCOUNTER — Other Ambulatory Visit: Payer: Self-pay | Admitting: Hematology and Oncology

## 2024-04-14 ENCOUNTER — Inpatient Hospital Stay

## 2024-04-14 DIAGNOSIS — C50411 Malignant neoplasm of upper-outer quadrant of right female breast: Secondary | ICD-10-CM

## 2024-04-14 NOTE — Progress Notes (Signed)
 START ON PATHWAY REGIMEN - Breast     Cycles 1 through 4: A cycle is every 21 days:     Pembrolizumab      Paclitaxel      Carboplatin      Filgrastim-xxxx    Cycles 5 through 8: A cycle is every 21 days:     Pembrolizumab      Doxorubicin      Cyclophosphamide      Pegfilgrastim-xxxx   **Always confirm dose/schedule in your pharmacy ordering system**  Patient Characteristics: Preoperative or Nonsurgical Candidate, M0 (Clinical Staging), Up to cT4c, Any N, M0, Neoadjuvant Therapy followed by Surgery, Invasive Disease, Chemotherapy, HER2 Negative, ER Negative, Platinum Therapy Indicated and Candidate for Checkpoint Inhibitor Therapeutic Status: Preoperative or Nonsurgical Candidate, M0 (Clinical Staging) AJCC M Category: cM0 AJCC Grade: G3 ER Status: Negative (-) AJCC 8 Stage Grouping: IIB HER2 Status: Negative (-) AJCC T Category: cT2 AJCC N Category: cN0 PR Status: Negative (-) Breast Surgical Plan: Neoadjuvant Therapy followed by Surgery Intent of Therapy: Curative Intent, Discussed with Patient

## 2024-04-17 ENCOUNTER — Encounter: Payer: Self-pay | Admitting: *Deleted

## 2024-04-17 ENCOUNTER — Encounter: Payer: Self-pay | Admitting: Adult Health

## 2024-04-17 ENCOUNTER — Encounter: Payer: Self-pay | Admitting: Hematology and Oncology

## 2024-04-17 ENCOUNTER — Encounter: Payer: Self-pay | Admitting: Radiology

## 2024-04-17 NOTE — Progress Notes (Unsigned)
 [facility]       Telephone: [phone]?Fax: [phone]   Oncology Clinical Pharmacist Practitioner Initial Assessment  Angela Marshall is a 48 y.o. female with a diagnosis of breast cancer. They were contacted today via in-person visit.  Indication/Regimen Pembrolizumab (Keytruda), paclitaxel (Taxol), carboplatin (Paraplatin ) followed by pembrolizumab Sigmund), doxorubicin (Adriamycin), cyclophosphamide (Cytoxan) is being used appropriately for treatment of breast cancer by Dr. Vinay Gudena.      Wt Readings from Last 1 Encounters:  04/12/24 215 lb (97.5 kg)    Estimated body surface area is 2.1 meters squared as calculated from the following:   Height as of 04/12/24: 5' 4 (1.626 m).   Weight as of 04/12/24: 215 lb (97.5 kg).  The dosing regimen cycle is every 21 days x 4 cycles  Pembrolizumab (200 mg) on Day 1 Paclitaxel (80 mg/m2) on Days 1, 8, 15 Carboplatin (AUC 1.5) on Day 1, 8, 15 Filgrastim (300 mcg or 480 mcg based on weight)  on Days 16, 17, 18  Followed by the below regimen cycle every 21 days x 4 cycles  Pembrolizumab (200 mg) Day 1 Doxorubicin (60 mg/m2) Day 1 Cyclophosphamide (600 mg/m2) Day 1 Pegfilgrastim (6 mg) on Day 3  It is planned to continue until treatment plan completion or unacceptable toxicity. The tentative start date is: 04/21/24  Dose Modifications None   Allergies Allergies  Allergen Reactions   Penicillins Hives    Product containing penicillin (product)    Vitals    04/12/2024    9:40 AM 04/12/2024    9:30 AM 04/12/2024    9:15 AM  Oncology Vitals  Temp 97.9 F (36.6 C)    Pulse Rate 66 64 71  BP 149/99 138/95 132/93  Resp 13 19 18   SpO2 100 % 100 % 100 %     Laboratory Data    Latest Ref Rng & Units 04/05/2024   12:48 PM  CBC EXTENDED  WBC 4.0 - 10.5 K/uL 5.9   RBC 3.87 - 5.11 MIL/uL 4.20   Hemoglobin 12.0 - 15.0 g/dL 87.3   HCT 63.9 - 53.9 % 35.7   Platelets 150 - 400 K/uL 216    NEUT# 1.7 - 7.7 K/uL 2.2   Lymph# 0.7 - 4.0 K/uL 3.0        Latest Ref Rng & Units 04/05/2024   12:48 PM  CMP  Glucose 70 - 99 mg/dL 89   BUN 6 - 20 mg/dL 17   Creatinine 9.55 - 1.00 mg/dL 8.78   Sodium 864 - 854 mmol/L 139   Potassium 3.5 - 5.1 mmol/L 3.2   Chloride 98 - 111 mmol/L 104   CO2 22 - 32 mmol/L 29   Calcium 8.9 - 10.3 mg/dL 9.6   Total Protein 6.5 - 8.1 g/dL 7.6   Total Bilirubin 0.0 - 1.2 mg/dL 0.8   Alkaline Phos 38 - 126 U/L 52   AST 15 - 41 U/L 19   ALT 0 - 44 U/L 27    No results found for: MG No results found for: [id number]   Contraindications Contraindications were reviewed? {yes/no:20286} Contraindications to therapy were identified? {YES/NO:21197}  Safety Precautions The following safety precautions were reviewed:  Fever: reviewed the importance of having a thermometer and the Centers for Disease Control and Prevention (CDC) definition of fever which is 100.75F (38C) or higher. Patient should call 24/7 triage at [phone] if experiencing a fever or any other symptoms Decreased white blood cells (WBCs)  and increased risk for infection Decreased platelet count and increased risk of bleeding Decreased hemoglobin, part of the red blood cells that carry iron and oxygen Nausea or vomiting Diarrhea or constipation Hair Loss Fatigue Changes in liver function Rash Peripheral Neuropathy Hypersensitivity reactions Pembrolizumab toxicities (skin, lung, liver, thyroid, kidneys, vision) Changes in color of urine Mouth sores MDS/AML Grapefruit products may interact with paclitaxel. Avoid use Handling body fluids and waste Intimacy, sexual activity, contraception, fertility ***  Medication Reconciliation Current Outpatient Medications  Medication Sig Dispense Refill   acetaminophen (TYLENOL) 500 MG tablet Take 1,000 mg by mouth every 6 (six) hours as needed for moderate pain (pain score 4-6) or mild pain (pain score 1-3).     atorvastatin (LIPITOR)  20 MG tablet Take 20 mg by mouth every evening.     cetirizine (ZYRTEC) 10 MG tablet Take 10 mg by mouth daily.     fluticasone (FLONASE) 50 MCG/ACT nasal spray Place 2 sprays into both nostrils daily as needed for allergies or rhinitis.     hydrochlorothiazide (HYDRODIURIL) 25 MG tablet Take 25 mg by mouth daily.     hydrOXYzine (ATARAX) 25 MG tablet Take 25 mg by mouth 3 (three) times daily as needed for itching or anxiety.     ibuprofen (ADVIL) 200 MG tablet Take 400 mg by mouth every 6 (six) hours as needed (Swelling).     losartan (COZAAR) 50 MG tablet Take 50 mg by mouth daily.     traMADol (ULTRAM) 50 MG tablet Take 1 tablet (50 mg total) by mouth every 6 (six) hours as needed for moderate pain (pain score 4-6) or severe pain (pain score 7-10). 25 tablet 0   Vitamin D, Ergocalciferol, (DRISDOL) 1.25 MG (50000 UNIT) CAPS capsule Take 50,000 Units by mouth 2 (two) times a week.     No current facility-administered medications for this visit.    Medication reconciliation is based on the patient's most recent medication list in the electronic medical record (EMR) including herbal products and OTC medications.   The patient's medication list was reviewed today with the patient? {YES/NO:21197}  Drug-drug interactions (DDIs) DDIs were evaluated? {yes/no:20286} Significant DDIs identified? {YES/NO:21197}  Drug-Food Interactions Drug-food interactions were evaluated? {yes/no:20286} Drug-food interactions identified? {YES/NO:21197}  Follow-up Plan  Treatment start date: 04/21/24 Port placement date: 04/12/24 ECHO date: 04/11/24 We reviewed the prescriptions, premedications, and treatment regimen with the patient. Possible side effects of the treatment regimen were reviewed and management strategies were discussed.  Can use over-the-counter (OTC) options of loperamide (Imodium) as needed for diarrhea, loratadine (Claritin) as needed for G-CSF bone pain, and docusate + senna (Senna-S) as  needed for constipation.  *** Clinical pharmacy will assist Dr. Vinay Gudena and Artasia M Montesano on an as needed basis going forward  JONALYN SEDLAK participated in the discussion, expressed understanding, and voiced agreement with the above plan. All questions were answered to her satisfaction. The patient was advised to contact the clinic at [phone] with any questions or concerns prior to her return visit.   I spent 60 minutes assessing the patient.  Ashur Glatfelter A. Lucila, PharmD, BCOP, CPP  Norleen DELENA Lucila, RPH-CPP, 04/17/2024 8:53 AM  **Disclaimer: This note was dictated with voice recognition software. Similar sounding words can inadvertently be transcribed and this note may contain transcription errors which may not have been corrected upon publication of note.**

## 2024-04-17 NOTE — Progress Notes (Signed)
 Patient called to inquire about financial assistance as she starts treatment.  Provided number to Lenise at [phone] which will be her advocate. She verbalized understanding.

## 2024-04-17 NOTE — Progress Notes (Signed)
 Patient called to inquire about financial assistance and other resources.  I forwarded her msg to her social worker Angela Marshall and also gave the pt her contact information advising that Angela Marshall will reach out to her when she returns to perform an assessment of her needs and concerns.  Pt verbalized understanding.

## 2024-04-18 ENCOUNTER — Other Ambulatory Visit: Payer: Self-pay | Admitting: *Deleted

## 2024-04-18 ENCOUNTER — Inpatient Hospital Stay: Admitting: Pharmacist

## 2024-04-18 ENCOUNTER — Inpatient Hospital Stay

## 2024-04-18 ENCOUNTER — Encounter: Payer: Self-pay | Admitting: Hematology and Oncology

## 2024-04-18 ENCOUNTER — Inpatient Hospital Stay: Admitting: *Deleted

## 2024-04-18 DIAGNOSIS — Z5112 Encounter for antineoplastic immunotherapy: Secondary | ICD-10-CM | POA: Diagnosis not present

## 2024-04-18 DIAGNOSIS — C50411 Malignant neoplasm of upper-outer quadrant of right female breast: Secondary | ICD-10-CM

## 2024-04-18 DIAGNOSIS — Z006 Encounter for examination for normal comparison and control in clinical research program: Secondary | ICD-10-CM

## 2024-04-18 MED ORDER — ONDANSETRON HCL 8 MG PO TABS: 8.0000 mg | ORAL_TABLET | Freq: Three times a day (TID) | ORAL | 1 refills | Status: AC | PRN

## 2024-04-18 MED ORDER — DEXAMETHASONE 4 MG PO TABS
ORAL_TABLET | ORAL | 1 refills | Status: AC
Start: 2024-04-18 — End: ?

## 2024-04-18 MED ORDER — PROCHLORPERAZINE MALEATE 10 MG PO TABS: 10.0000 mg | ORAL_TABLET | Freq: Four times a day (QID) | ORAL | 1 refills | Status: AC | PRN

## 2024-04-18 MED ORDER — LIDOCAINE-PRILOCAINE 2.5-2.5 % EX CREA
TOPICAL_CREAM | CUTANEOUS | 3 refills | Status: AC
Start: 2024-04-18 — End: ?

## 2024-04-18 NOTE — Research (Signed)
 D7794, ICE COMPRESS: RANDOMIZED TRIAL OF LIMB CRYOCOMPRESSION VERSUS CONTINUOUS COMPRESSION VERSUS LOW CYCLIC COMPRESSION FOR THE PREVENTION  OF TAXANE-INDUCED PERIPHERAL NEUROPATHY  This  has reviewed this patient's inclusion and exclusion criteria as a second review and confirms Angela Marshall is eligible for study participation.  Patient may continue with enrollment.  Delon Pinal BSN RN Clinical Research Nurse [facility] Direct Dial: [phone] 04/18/2024  3:20 PM

## 2024-04-18 NOTE — Research (Signed)
 Trial Name:  S2205, ICE COMPRESS: RANDOMIZED TRIAL OF LIMB CRYOCOMPRESSION VERSUS CONTINUOUS COMPRESSION VERSUS LOW CYCLIC COMPRESSION FOR THE PREVENTION  OF TAXANE-INDUCED PERIPHERAL NEUROPATHY   CONSENT Patient Angela Marshall was identified by Dr. Gudena as a potential candidate for the above listed study.  This Clinical Research Nurse met with Angela Marshall, Angela Marshall on 04/18/24 in a manner and location that ensures patient privacy to discuss participation in the above listed research study.  Patient is Unaccompanied.  Patient was previously provided with informed consent documents.  Patient confirmed they have read the informed consent documents.  As outlined in the informed consent form, this Nurse and Angela Marshall discussed the purpose of the research study, the investigational nature of the study, study procedures and requirements for study participation, potential risks and benefits of study participation, as well as alternatives to participation.  This study is not blinded or double-blinded. The patient understands participation is voluntary and they may withdraw from study participation at any time.  Each study arm was reviewed, and randomization discussed.  Potential side effects were reviewed with patient as outlined in the consent form, and patient made aware there may be side effects not yet known. This study does not involve a placebo. Patient understands enrollment is pending full eligibility review.   Confidentiality and how the patient's information will be used as part of study participation were discussed.  Patient was informed there is not reimbursement provided for their time and effort spent on trial participation.  The patient is encouraged to discuss research study participation with their insurance provider to determine what costs they may incur as part of study participation, including research related injury.    All questions were answered to patient's satisfaction.   The informed consent and separate HIPAA Authorization was reviewed page by page.  The patient's mental and emotional status is appropriate to provide informed consent, and the patient verbalizes an understanding of study participation.  Patient has agreed to participate in the above listed research study and has voluntarily signed the informed consent Protocol Version Date 10/19/23 and and separate HIPAA Authorization, version IRB approved 02/25/24 on 04/18/24 at 12:13PM.  Patient did not agree to the optional collection and use of her specimens or to be contacted by the study in the future. The patient was provided with a copy of the signed informed consent form and separate HIPAA Authorization for their reference.  No study specific procedures were obtained prior to the signing of the informed consent document.  Approximately 20 minutes were spent with the patient reviewing the informed consent documents.  Patient was not requested to complete a Release of Information form.   ELIGIBILITY REVIEW This Nurse has reviewed this patient's inclusion and exclusion criteria and confirmed Angela Marshall is eligible for study participation.  Confirmed the following with patient today: - The patient has not previously received neurotoxic chemotherapy - The patient does not have pre-existing clinical peripheral neuropathy from any cause - The patient does not have a history of Raynaud's phenomenon, cold agglutinin disease, cryoglobulinemia, cryofibrinogenemia, post-traumatic cold dystrophy, or peripheral arterial ischemia.  - The patient does not have any open skin wounds or ulcers of the limbs  Eligibility confirmed by treating investigator, who also agrees that patient should proceed with enrollment.   BASELINE PROs Pt completed EORTC QLQ-CIPN20 and PROMIS-29 Profile v2.1 after signing consent. Collected and checked for completeness and accuracy.   BASELINE NEUROPATHY ASSESSMENT All neuropathy assessments  (Neuropen, [place] ,and Timed Get  Up and Go) were completed by this research RN. Angela Marshall    PLAN Patient understands that intervention with treatment device will begin with her first treatment scheduled for this Friday 04/21/24. Reviewed clothing recommendations and provided patient information hand-out.  Informed patient this research RN will randomize patient on the study before her first treatment and will let patient know what arm of the study she received at her infusion visit. Patient states understanding and agreement to this plan.   RANDOMIZATION Patient was registered to the study and randomized to Arm 2 Continuous Compression.   Cherylyn Hoard, BSN, RN, [employer] Research Nurse II [phone] 04/18/2024

## 2024-04-18 NOTE — Research (Signed)
 Trial Name:  [employer] 2021-05 - Specimen Collection Study to Evaluate Biomarkers in Subjects with Cancer    Patient Angela Marshall was identified by Dr. Gudena as a potential candidate for the above listed study.  This Clinical Research Nurse met with Angela Marshall, [id number] on 04/18/24 in a manner and location that ensures patient privacy to discuss participation in the above listed research study.  Patient is Unaccompanied.  Patient was previously provided with informed consent documents.  Patient confirmed they have read the informed consent documents.  As outlined in the informed consent form, this Nurse and [name] discussed the purpose of the research study, the investigational nature of the study, study procedures and requirements for study participation, potential risks and benefits of study participation, as well as alternatives to participation.  This study is not blinded or double-blinded. The patient understands participation is voluntary and they may withdraw from study participation at any time.  This study does not involve randomization.  This study does not involve an investigational drug or device. This study does not involve a placebo. Patient understands enrollment is pending full eligibility review.   Confidentiality and how the patient's information will be used as part of study participation were discussed.  Patient was informed there is reimbursement provided for their time and effort spent on trial participation.  The patient is encouraged to discuss research study participation with their insurance provider to determine what costs they may incur as part of study participation, including research related injury.    All questions were answered to patient's satisfaction.  The informed consent with embedded HIPAA language was reviewed page by page.  The patient's mental and emotional status is appropriate to provide informed consent, and the patient verbalizes an  understanding of study participation.  Patient has agreed to participate in the above listed research study and has voluntarily signed the informed consent version IRB approved 29 Feb 2024 on 04/18/24 at 1PM.  The patient was provided with a copy of the signed informed consent form with embedded HIPAA language for their reference.  No study specific procedures were obtained prior to the signing of the informed consent document.  Approximately 15 minutes were spent with the patient reviewing the informed consent documents.  Patient was not requested to complete a Release of Information form.   Eligibility: Eligibility criteria reviewed with patient. This nurse/coordinator has reviewed this patient's inclusion and exclusion criteria and confirmed patient is eligible for study participation. Eligibility confirmed by treating investigator, who also agrees that patient should proceed with enrollment. Patient will continue with enrollment.  Data Collection: Patient was interviewed to collect the following information. Has participant been diagnosed with: High Blood Pressure   Yes Coronary Artery Disease                No Myocardial Infarction                       No Congestive Heart Failure               No Peripheral Vascular Disease          No Cerebrovascular Disease              No Chronic Pulmonary Disease             No COPD (incl Emphysema,Chronic Bronchitis)    No Lupus  No Rheumatoid Arthritis         No Rheumatoid Disease         No Diabetes                  No Dementia                         No Hemiplegia or Paraplegia No Barrett's Esophagus       No Gastric Ulcer                 No Peptic Ulcer Disease      No Mild Liver Disease           No Moderate or Severe Liver Disease  No Liver Cirrhosis                                 No Helicobacter Pylori (H. Pylori)          No Pancreatitis                                       No Renal Disease                                 No Chronic Kidney Disease (CKD)   No Ulcerative Colitis     No Crohn's Disease    No Colorectal Polyps   No Lynch Syndrome    No Hepatitis B or C     No  Does the patient have a personal history of cancer (greater than 5 years ago)?  No  Does the patient have a family history of cancer in 1st or 2nd degree relatives? Yes If yes, Relationship(s) and Cancer type(s)? Father had Brain Cancer and Aunt had Ovarian Cancer.   Does the patient have history of alcohol consumption? Yes   If yes, current or former? Current Number of years? 29 Drinks per week? 2  Does the patient have history of cigarette, cigar, pipe, or chewing tobacco use?  No   Blood Collection: Research blood will be obtained by [facility] a Cath per patient's preference during her scheduled lab appt prior to starting Chemotherapy this Friday 10/124/25.   Patient was thanked for their participation in this study and encouraged to contact this research nurse if any questions before next visit. She verbalized understanding.  Cherylyn Hoard, BSN, RN, [employer] Research Nurse II [phone] 04/18/2024

## 2024-04-19 ENCOUNTER — Telehealth: Payer: Self-pay | Admitting: Licensed Clinical Social Worker

## 2024-04-19 NOTE — Telephone Encounter (Signed)
 [facility] Clinical Social Work  CSW received VM from patient asking about potential grants as well as disability and any other resources. Stated she will be here Friday for appointments and is willing to be seen then.  CSW will meet with pt in infusion on Friday 10/24.   [name] E Shailyn Weyandt, LCSW

## 2024-04-21 ENCOUNTER — Inpatient Hospital Stay: Admitting: Licensed Clinical Social Worker

## 2024-04-21 ENCOUNTER — Encounter: Payer: Self-pay | Admitting: Adult Health

## 2024-04-21 ENCOUNTER — Inpatient Hospital Stay

## 2024-04-21 ENCOUNTER — Encounter: Payer: Self-pay | Admitting: *Deleted

## 2024-04-21 ENCOUNTER — Inpatient Hospital Stay (HOSPITAL_BASED_OUTPATIENT_CLINIC_OR_DEPARTMENT_OTHER): Admitting: Adult Health

## 2024-04-21 VITALS — BP 127/76 | HR 74 | Temp 98.1°F | Resp 18 | Ht 64.0 in | Wt 217.4 lb

## 2024-04-21 VITALS — BP 134/85 | HR 72 | Temp 98.4°F | Resp 16

## 2024-04-21 DIAGNOSIS — Z006 Encounter for examination for normal comparison and control in clinical research program: Secondary | ICD-10-CM

## 2024-04-21 DIAGNOSIS — C50411 Malignant neoplasm of upper-outer quadrant of right female breast: Secondary | ICD-10-CM

## 2024-04-21 DIAGNOSIS — Z95828 Presence of other vascular implants and grafts: Secondary | ICD-10-CM | POA: Insufficient documentation

## 2024-04-21 DIAGNOSIS — Z5112 Encounter for antineoplastic immunotherapy: Secondary | ICD-10-CM | POA: Diagnosis not present

## 2024-04-21 DIAGNOSIS — Z170 Estrogen receptor positive status [ER+]: Secondary | ICD-10-CM | POA: Diagnosis not present

## 2024-04-21 LAB — CMP (CANCER CENTER ONLY)
ALT: 20 U/L (ref 0–44)
AST: 15 U/L (ref 15–41)
Albumin: 3.9 g/dL (ref 3.5–5.0)
Alkaline Phosphatase: 49 U/L (ref 38–126)
Anion gap: 6 (ref 5–15)
BUN: 13 mg/dL (ref 6–20)
CO2: 27 mmol/L (ref 22–32)
Calcium: 9.3 mg/dL (ref 8.9–10.3)
Chloride: 105 mmol/L (ref 98–111)
Creatinine: 1.09 mg/dL — ABNORMAL HIGH (ref 0.44–1.00)
GFR, Estimated: >60 mL/min (ref >60)
Glucose, Bld: 102 mg/dL — ABNORMAL HIGH (ref 70–99)
Potassium: 3.6 mmol/L (ref 3.5–5.1)
Sodium: 138 mmol/L (ref 135–145)
Total Bilirubin: 0.8 mg/dL (ref 0.0–1.2)
Total Protein: 6.8 g/dL (ref 6.5–8.1)

## 2024-04-21 LAB — CBC WITH DIFFERENTIAL (CANCER CENTER ONLY)
Abs Immature Granulocytes: 0.01 K/uL (ref 0.00–0.07)
Basophils Absolute: 0 K/uL (ref 0.0–0.1)
Basophils Relative: 1 %
Eosinophils Absolute: 0.2 K/uL (ref 0.0–0.5)
Eosinophils Relative: 2 %
HCT: 33.9 % — ABNORMAL LOW (ref 36.0–46.0)
Hemoglobin: 11.9 g/dL — ABNORMAL LOW (ref 12.0–15.0)
Immature Granulocytes: 0 %
Lymphocytes Relative: 39 %
Lymphs Abs: 2.4 K/uL (ref 0.7–4.0)
MCH: 30.3 pg (ref 26.0–34.0)
MCHC: 35.1 g/dL (ref 30.0–36.0)
MCV: 86.3 fL (ref 80.0–100.0)
Monocytes Absolute: 0.5 K/uL (ref 0.1–1.0)
Monocytes Relative: 8 %
Neutro Abs: 3.1 K/uL (ref 1.7–7.7)
Neutrophils Relative %: 50 %
Platelet Count: 196 K/uL (ref 150–400)
RBC: 3.93 MIL/uL (ref 3.87–5.11)
RDW: 13.2 % (ref 11.5–15.5)
WBC Count: 6.2 K/uL (ref 4.0–10.5)
nRBC: 0 % (ref 0.0–0.2)

## 2024-04-21 LAB — RESEARCH LABS

## 2024-04-21 LAB — TSH: TSH: 1.35 u[IU]/mL (ref 0.350–4.500)

## 2024-04-21 MED ORDER — SODIUM CHLORIDE 0.9 % IV SOLN
200.0000 mg | Freq: Once | INTRAVENOUS | Status: AC
  Administered 2024-04-21: 200 mg via INTRAVENOUS
  Filled 2024-04-21: qty 200

## 2024-04-21 MED ORDER — ALTEPLASE 2 MG IJ SOLR
2.0000 mg | Freq: Once | INTRAMUSCULAR | Status: AC
  Administered 2024-04-21: 2 mg
  Filled 2024-04-21: qty 2

## 2024-04-21 MED ORDER — PALONOSETRON HCL INJECTION 0.25 MG/5ML
0.2500 mg | Freq: Once | INTRAVENOUS | Status: AC
  Administered 2024-04-21: 0.25 mg via INTRAVENOUS
  Filled 2024-04-21: qty 5

## 2024-04-21 MED ORDER — SODIUM CHLORIDE 0.9 % IV SOLN
80.0000 mg/m2 | Freq: Once | INTRAVENOUS | Status: AC
  Administered 2024-04-21: 168 mg via INTRAVENOUS
  Filled 2024-04-21: qty 28

## 2024-04-21 MED ORDER — SODIUM CHLORIDE 0.9 % IV SOLN
192.9000 mg | Freq: Once | INTRAVENOUS | Status: AC
  Administered 2024-04-21: 190 mg via INTRAVENOUS
  Filled 2024-04-21: qty 19

## 2024-04-21 MED ORDER — DEXAMETHASONE SOD PHOSPHATE PF 10 MG/ML IJ SOLN
10.0000 mg | Freq: Once | INTRAMUSCULAR | Status: AC
  Administered 2024-04-21: 10 mg via INTRAVENOUS

## 2024-04-21 MED ORDER — DIPHENHYDRAMINE HCL 50 MG/ML IJ SOLN
50.0000 mg | Freq: Once | INTRAMUSCULAR | Status: AC
  Administered 2024-04-21: 50 mg via INTRAVENOUS
  Filled 2024-04-21: qty 1

## 2024-04-21 MED ORDER — FAMOTIDINE IN NACL 20-0.9 MG/50ML-% IV SOLN
20.0000 mg | Freq: Once | INTRAVENOUS | Status: AC
  Administered 2024-04-21: 20 mg via INTRAVENOUS
  Filled 2024-04-21: qty 50

## 2024-04-21 MED ORDER — SODIUM CHLORIDE 0.9 % IV SOLN: INTRAVENOUS | Status: DC

## 2024-04-21 MED ORDER — SODIUM CHLORIDE 0.9% FLUSH: 10.0000 mL | INTRAVENOUS | Status: DC | PRN

## 2024-04-21 NOTE — Progress Notes (Signed)
 [facility] Clinical Social Work  Initial Assessment   Angela Marshall is a 48 y.o. year old female accompanied by sister, Angela Marshall. Clinical Social Work was referred by self for financial concerns.   SDOH (Social Determinants of Health) assessments performed: Yes SDOH Interventions    Flowsheet Row Clinical Support from 04/21/2024 in [facility] - A Dept Of [place]  SDOH Interventions   Utilities Interventions [facility] Referral, Other (Comment)  [employer]]    SDOH Screenings   Food Insecurity: No Food Insecurity (04/05/2024)  Housing: Low Risk  (04/05/2024)  Transportation Needs: No Transportation Needs (04/05/2024)  Utilities: At Risk (04/21/2024)  Depression (PHQ2-9): Low Risk  (04/21/2024)  Tobacco Use: Medium Risk (04/21/2024)    PHQ 2/9:    04/21/2024    9:49 AM 04/05/2024    1:18 PM  Depression screen PHQ 2/9  Decreased Interest 0 0  Down, Depressed, Hopeless 0 0  PHQ - 2 Score 0 0     Distress Screen completed: Yes    04/21/2024   11:53 AM  ONCBCN DISTRESS SCREENING  Screening Type Initial Screening  How much distress have you been experiencing in the past week? (0-10) 2  Practical concerns type Work;Finances  Emotional concerns type Worry or anxiety  Physical Concerns Type  Sleep;Fatigue      Family/Social Information:  Housing Arrangement: patient lives alone. She owns her home. Her boyfriend travels but is there sometimes Family members/support persons in your life? Sister, friend Transportation concerns: no  Employment: Out on work excuse. 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 Working on [employer] and short-term disability paperwork  Income source: waiting on short term disability Financial concerns: Yes, due to illness and/or loss of work during treatment Type of concern: Editor, commissioning, Government social research officer, Adult nurse, and Medical bills Food  access concerns: no Religious or spiritual practice: Not known Advanced directives: No Services Currently in place:  BCBS; FMLA & short term disability paperwork given to pt forms team  Coping/ Adjustment to diagnosis: Patient understands treatment plan and what happens next? yes, starting chemo today. She has some anxiety as she starts treatment. She is concerned about the decrease in income and is trying to plan ahead Concerns about diagnosis and/or treatment: Losing my job and/or losing income Patient reported stressors: Finances, Anxiety/ nervousness, and Adjusting to my illness Current coping skills/ strengths: Capable of independent living , Manufacturing systems engineer , Motivation for treatment/growth , and Supportive family/friends     SUMMARY: Current SDOH Barriers:  Financial constraints related to decreased income due to work leave while on treatment  Clinical Social Work Clinical Goal(s):  Scientist, research (life sciences) options for unmet needs related to:  Financial Strain   Interventions: Discussed common feeling and emotions when being diagnosed with cancer, and the importance of support during treatment Informed patient of the support team roles and support services at [facility] Provided CSW contact information and encouraged patient to call with any questions or concerns Referred patient to community resources: [employer]  Provided pt with information on cancer foundations and applications for [employer] in [place] Completed applications for [employer], [employer], and [employer] sent to [employer] with [facility] with request to reach out to pt re: [facility] Medicaid   Follow Up Plan: Patient will work on [employer] CSW will follow-up with pt during next infusion appt  Patient  verbalizes understanding of plan: Yes    Angela Lafon E Ferol Laiche, LCSW Clinical Social Worker [facility]

## 2024-04-21 NOTE — Progress Notes (Signed)
 [facility] Cancer Follow up:    Angela Marshall SAILOR, FNP [address] [name] [address]   DIAGNOSIS:  Cancer Staging  Malignant neoplasm of upper-outer quadrant of right breast in female, estrogen receptor positive (HCC) Staging form: Breast, AJCC 8th Edition - Clinical: Stage IIB (cT2, cN0, cM0, G3, ER-, PR-, HER2-) - Signed by Odean Potts, MD on 04/05/2024 Stage prefix: Initial diagnosis Histologic grading system: 3 grade system    SUMMARY OF ONCOLOGIC HISTORY: Oncology History  Malignant neoplasm of upper-outer quadrant of right breast in female, estrogen receptor positive (HCC)  03/30/2024 Initial Diagnosis   Screening mammogram detected right upper outer quadrant mass 2.6 cm by mammogram, by ultrasound it measured 2.2 cm at 10 o'clock position, axilla negative.  Left breast: Cysts: Aspirated Biopsy: Grade 3 IDC ER 2%, PR 0%, Ki67 60%, HER2 negative   04/05/2024 Cancer Staging   Staging form: Breast, AJCC 8th Edition - Clinical: Stage IIB (cT2, cN0, cM0, G3, ER-, PR-, HER2-) - Signed by Odean Potts, MD on 04/05/2024 Stage prefix: Initial diagnosis Histologic grading system: 3 grade system   04/21/2024 -  Chemotherapy   Patient is on Treatment Plan : BREAST Pembrolizumab (200) D1 + Carboplatin (1.5) D1,8,15 + Paclitaxel (80) D1,8,15 q21d X 4 cycles / Pembrolizumab (200) D1 + AC D1 q21d x 4 cycles       CURRENT THERAPY: Keytruda, Taxol, Carbo  INTERVAL HISTORY:  Discussed the use of AI scribe software for clinical note transcription with the patient, who gave verbal consent to proceed.  History of Present Illness Angela Marshall is a 48 year old female with left breast invasive ductal carcinoma, triple negative, who presents for follow-up and evaluation prior to receiving chemotherapy.  She was diagnosed with left breast invasive ductal carcinoma, triple negative, following a screening mammogram and biopsy earlier this month. Initially,  she could feel the cancer in her breast, but now she cannot feel it, possibly due to changes post-biopsy.  Her most recent echocardiogram on April 11, 2024, showed a left ventricular ejection fraction of 55-60%.  She has been prescribed nausea medications including Zofran, Compazine, and dexamethasone, and expresses some confusion about the medication schedule. She has tramadol for pain but has stopped taking it as she currently has no pain. She also takes hydroxyzine and melatonin for sleep, and continues Zyrtec daily.  She reports no stomach pain and feels well overall. She is not currently experiencing nausea.     Patient Active Problem List   Diagnosis Date Noted   Port-A-Cath in place 04/21/2024   Family history of brain cancer    Family history of breast cancer    Family history of ovarian cancer    Breast cancer (HCC)    Malignant neoplasm of upper-outer quadrant of right breast in female, estrogen receptor positive (HCC) 04/03/2024    is allergic to penicillins.  MEDICAL HISTORY: Past Medical History:  Diagnosis Date   Anxiety    Breast cancer (HCC)    hx right breast ca in 02/2024   Family history of brain cancer    Father at 73   Family history of breast cancer    maternal grandmother   Family history of ovarian cancer    Paternal 1st Cousin at 37   HLD (hyperlipidemia)    Hypertension    Seasonal allergies     SURGICAL HISTORY: Past Surgical History:  Procedure Laterality Date   ABDOMINOPLASTY     HERNIA REPAIR  as a child 49 -29yrs old   PORTACATH PLACEMENT N/A 04/12/2024   Procedure: INSERTION, TUNNELED CENTRAL VENOUS DEVICE, WITH PORT;  Surgeon: Vernetta Berg, MD;  Location: [facility] OR;  Service: General;  Laterality: N/A;  PORT PLACEMENT WITH ULTRASOUND GUIDANCE   WISDOM TOOTH EXTRACTION      SOCIAL HISTORY: Social History   Socioeconomic History   Marital status: Legally Separated    Spouse name: Not on file   Number of children: Not on file    Years of education: Not on file   Highest education level: Not on file  Occupational History   Not on file  Tobacco Use   Smoking status: Former    Current packs/day: 0.25    Types: Cigarettes   Smokeless tobacco: Never   Tobacco comments:    Quit smoking 09/2023  Vaping Use   Vaping status: Former  Substance and Sexual Activity   Alcohol use: Yes    Alcohol/week: 1.0 - 2.0 standard drink of alcohol    Types: 1 - 2 Standard drinks or equivalent per week    Comment: wine/liquor   Drug use: Not on file    Comment: CBD Gummies   Sexual activity: Yes    Birth control/protection: Post-menopausal  Other Topics Concern   Not on file  Social History Narrative   Not on file   Social Drivers of Health   Financial Resource Strain: Not on file  Food Insecurity: No Food Insecurity (04/05/2024)   Hunger Vital Sign    Worried About Running Out of Food in the Last Year: Never true    Ran Out of Food in the Last Year: Never true  Transportation Needs: No Transportation Needs (04/05/2024)   PRAPARE - Administrator, Civil Service (Medical): No    Lack of Transportation (Non-Medical): No  Physical Activity: Not on file  Stress: Not on file  Social Connections: Not on file  Intimate Partner Violence: Not At Risk (04/05/2024)   Humiliation, Afraid, Rape, and Kick questionnaire    Fear of Current or Ex-Partner: No    Emotionally Abused: No    Physically Abused: No    Sexually Abused: No    FAMILY HISTORY: Family History  Problem Relation Age of Onset   Brain cancer Father 64   Breast cancer Maternal Grandmother 19   Cancer Paternal Aunt 85 - 69       Unknown Type   Ovarian cancer Paternal Cousin 69   Cancer Paternal Uncle        Unknown type    Review of Systems  Constitutional:  Negative for appetite change, chills, fatigue, fever and unexpected weight change.  HENT:   Negative for hearing loss, lump/mass and trouble swallowing.   Eyes:  Negative for eye problems  and icterus.  Respiratory:  Negative for chest tightness, cough and shortness of breath.   Cardiovascular:  Negative for chest pain, leg swelling and palpitations.  Gastrointestinal:  Negative for abdominal distention, abdominal pain, constipation, diarrhea, nausea and vomiting.  Endocrine: Negative for hot flashes.  Genitourinary:  Negative for difficulty urinating.   Musculoskeletal:  Negative for arthralgias.  Skin:  Negative for itching and rash.  Neurological:  Negative for dizziness, extremity weakness, headaches and numbness.  Hematological:  Negative for adenopathy. Does not bruise/bleed easily.  Psychiatric/Behavioral:  Negative for depression. The patient is not nervous/anxious.       PHYSICAL EXAMINATION     Vitals:   04/21/24 0939  BP: 127/76  Pulse: 74  Resp: 18  Temp: 98.1 F (36.7 C)  SpO2: 99%    Physical Exam Constitutional:      General: She is not in acute distress.    Appearance: Normal appearance. She is not toxic-appearing.  HENT:     Head: Normocephalic and atraumatic.     Mouth/Throat:     Mouth: Mucous membranes are moist.     Pharynx: Oropharynx is clear. No oropharyngeal exudate or posterior oropharyngeal erythema.  Eyes:     General: No scleral icterus. Cardiovascular:     Rate and Rhythm: Normal rate and regular rhythm.     Pulses: Normal pulses.     Heart sounds: Normal heart sounds.  Pulmonary:     Effort: Pulmonary effort is normal.     Breath sounds: Normal breath sounds.  Abdominal:     General: Abdomen is flat. Bowel sounds are normal. There is no distension.     Palpations: Abdomen is soft.     Tenderness: There is no abdominal tenderness.  Musculoskeletal:        General: No swelling.     Cervical back: Neck supple.  Lymphadenopathy:     Cervical: No cervical adenopathy.  Skin:    General: Skin is warm and dry.     Findings: No rash.  Neurological:     General: No focal deficit present.     Mental Status: She is alert.   Psychiatric:        Mood and Affect: Mood normal.        Behavior: Behavior normal.     LABORATORY DATA:  CBC    Component Value Date/Time   WBC 6.2 04/21/2024 0919   RBC 3.93 04/21/2024 0919   HGB 11.9 (L) 04/21/2024 0919   HCT 33.9 (L) 04/21/2024 0919   PLT 196 04/21/2024 0919   MCV 86.3 04/21/2024 0919   MCH 30.3 04/21/2024 0919   MCHC 35.1 04/21/2024 0919   RDW 13.2 04/21/2024 0919   LYMPHSABS 2.4 04/21/2024 0919   MONOABS 0.5 04/21/2024 0919   EOSABS 0.2 04/21/2024 0919   BASOSABS 0.0 04/21/2024 0919    CMP     Component Value Date/Time   NA 138 04/21/2024 0919   K 3.6 04/21/2024 0919   CL 105 04/21/2024 0919   CO2 27 04/21/2024 0919   GLUCOSE 102 (H) 04/21/2024 0919   BUN 13 04/21/2024 0919   CREATININE 1.09 (H) 04/21/2024 0919   CALCIUM 9.3 04/21/2024 0919   PROT 6.8 04/21/2024 0919   ALBUMIN 3.9 04/21/2024 0919   AST 15 04/21/2024 0919   ALT 20 04/21/2024 0919   ALKPHOS 49 04/21/2024 0919   BILITOT 0.8 04/21/2024 0919   GFRNONAA >60 04/21/2024 0919     ASSESSMENT and THERAPY PLAN:   Malignant neoplasm of upper-outer quadrant of right breast in female, estrogen receptor positive (HCC) 03/30/2024:Screening mammogram detected right upper outer quadrant mass 2.6 cm by mammogram, by ultrasound it measured 2.2 cm at 10 o'clock position, axilla negative.  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 After reviewing the pathology in detail, we proceeded to discuss the different treatment options between surgery, radiation, chemotherapy, antiestrogen therapies.  Treatment plan: Neoadjuvant chemotherapy with Taxol carbo Keytruda followed by Adriamycin Cytoxan Keytruda followed by Sharion  maintenance for 1 year Breast conserving surgery with sentinel lymph node biopsy Adjuvant radiation  Patient will also be counseled for Scarlet clinical trial Current treatment:  Keytruda, Taxol, Carbo  Assessment and Plan Assessment & Plan Left breast invasive ductal carcinoma, triple negative Diagnosed earlier this month. Echocardiogram showed LVEF 55-60%. - Administer neoadjuvant chemotherapy with Keytruda, carboplatin, and Taxol. - Monitor blood counts regularly for neutropenia risk and potential febrile neutropenia hospitalization. - Provide Zofran, Compazine, and dexamethasone with specific anti-nausea instructions. - Advise hydroxyzine and melatonin for sleep as needed. - Instruct to keep tramadol for potential bone pain. - Advise daily Zyrtec. - Schedule follow-up for chemotherapy and monitoring.   All questions were answered. The patient knows to call the clinic with any problems, questions or concerns. We can certainly see the patient much sooner if necessary.  Total encounter time:45 minutes*in face-to-face visit time, chart review, lab review, care coordination, order entry, and documentation of the encounter time.    Morna Kendall, NP 04/23/24 11:09 PM Medical Oncology and Hematology [facility] [address] [place], [place] [address] Tel. [phone]    Fax. [phone]  *Total Encounter Time as defined by the Centers for Medicare and Medicaid Services includes, in addition to the face-to-face time of a patient visit (documented in the note above) non-face-to-face time: obtaining and reviewing outside history, ordering and reviewing medications, tests or procedures, care coordination (communications with other health care professionals or caregivers) and documentation in the medical record.

## 2024-04-21 NOTE — Research (Signed)
 [employer] 2021-05 - Specimen Collection Study to Evaluate Biomarkers in Subjects with Cancer   Eligibility: Eligibility criteria reviewed with patient. This Nurse has reviewed this patient's inclusion and exclusion criteria and confirmed patient is eligible for study participation. Eligibility confirmed by treating investigator, who also agrees that patient should proceed with enrollment. Patient will continue with enrollment. Blood Collection: Research blood obtained by Fresh venipuncture. Patient preferred using port a cath but it was not working for blood draw today. Patient tolerated peripheral blood draw well without any adverse events. Gift Card: $50 gift card given to patient for her participation in this study.   Cherylyn Hoard, BSN, RN, [employer] Research Nurse II [phone] 04/21/2024

## 2024-04-21 NOTE — Research (Signed)
 [employer] 2021-05 - Specimen Collection Study to Evaluate Biomarkers in Subjects with Cancer    This Coordinator has reviewed this patient's inclusion and exclusion criteria as a second review and confirms Angela Marshall is eligible for study participation.  Patient may continue with enrollment.   Abelardo Jock Clinical Research Coordinator [phone] 04/21/2024 3:12 PM

## 2024-04-21 NOTE — Patient Instructions (Signed)
 [facility] - A DEPT OF [facility]  Discharge Instructions: Thank you for choosing [facility] to provide your oncology and hematology care.   If you have a lab appointment with the Cancer Center, please go directly to the Cancer Center and check in at the registration area.   Wear comfortable clothing and clothing appropriate for easy access to any Portacath or PICC line.   We strive to give you quality time with your provider. You may need to reschedule your appointment if you arrive late (15 or more minutes).  Arriving late affects you and other patients whose appointments are after yours.  Also, if you miss three or more appointments without notifying the office, you may be dismissed from the clinic at the provider's discretion.      For prescription refill requests, have your pharmacy contact our office and allow 72 hours for refills to be completed.    Today you received the following chemotherapy and/or immunotherapy agents: Keytruda (Pembrolizumab), Taxol (Paclitaxel) and Paraplatin (Carboplatin)       To help prevent nausea and vomiting after your treatment, we encourage you to take your nausea medication as directed.  BELOW ARE SYMPTOMS THAT SHOULD BE REPORTED IMMEDIATELY: *FEVER GREATER THAN 100.4 F (38 C) OR HIGHER *CHILLS OR SWEATING *NAUSEA AND VOMITING THAT IS NOT CONTROLLED WITH YOUR NAUSEA MEDICATION *UNUSUAL SHORTNESS OF BREATH *UNUSUAL BRUISING OR BLEEDING *URINARY PROBLEMS (pain or burning when urinating, or frequent urination) *BOWEL PROBLEMS (unusual diarrhea, constipation, pain near the anus) TENDERNESS IN MOUTH AND THROAT WITH OR WITHOUT PRESENCE OF ULCERS (sore throat, sores in mouth, or a toothache) UNUSUAL RASH, SWELLING OR PAIN  UNUSUAL VAGINAL DISCHARGE OR ITCHING   Items with * indicate a potential emergency and should be followed up as soon as possible or go to the Emergency Department if any problems should  occur.  Please show the CHEMOTHERAPY ALERT CARD or IMMUNOTHERAPY ALERT CARD at check-in to the Emergency Department and triage nurse.  Should you have questions after your visit or need to cancel or reschedule your appointment, please contact [facility] - A DEPT OF [facility]  Dept: [phone]  and follow the prompts.  Office hours are 8:00 a.m. to 4:30 p.m. Monday - Friday. Please note that voicemails left after 4:00 p.m. may not be returned until the following business day.  We are closed weekends and major holidays. You have access to a nurse at all times for urgent questions. Please call the main number to the clinic Dept: [phone] and follow the prompts.   For any non-urgent questions, you may also contact your provider using MyChart. We now offer e-Visits for anyone 98 and older to request care online for non-urgent symptoms. For details visit [email or web address].   Also download the MyChart app! Go to the app store, search MyChart, open the app, select , and log in with your MyChart username and password.  Pembrolizumab Injection What is this medication? PEMBROLIZUMAB (PEM broe LIZ ue mab) treats some types of cancer. It works by helping your immune system slow or stop the spread of cancer cells. It is a monoclonal antibody. This medicine may be used for other purposes; ask your health care provider or pharmacist if you have questions. COMMON BRAND NAME(S): Keytruda What should I tell my care team before I take this medication? They need to know if you have any of these conditions: Allogeneic stem  cell transplant (uses someone else's stem cells) Autoimmune diseases, such as Crohn disease, ulcerative colitis, lupus History of chest radiation Nervous system problems, such as Guillain-Barre syndrome, myasthenia gravis Organ transplant An unusual or allergic reaction to pembrolizumab, other medications, foods, dyes, or  preservatives Pregnant or trying to get pregnant Breast-feeding How should I use this medication? This medication is injected into a vein. It is given by your care team in a hospital or clinic setting. A special MedGuide will be given to you before each treatment. Be sure to read this information carefully each time. Talk to your care team about the use of this medication in children. While it may be prescribed for children as young as 6 months for selected conditions, precautions do apply. Overdosage: If you think you have taken too much of this medicine contact a poison control center or emergency room at once. NOTE: This medicine is only for you. Do not share this medicine with others. What if I miss a dose? Keep appointments for follow-up doses. It is important not to miss your dose. Call your care team if you are unable to keep an appointment. What may interact with this medication? Interactions have not been studied. This list may not describe all possible interactions. Give your health care provider a list of all the medicines, herbs, non-prescription drugs, or dietary supplements you use. Also tell them if you smoke, drink alcohol, or use illegal drugs. Some items may interact with your medicine. What should I watch for while using this medication? Your condition will be monitored carefully while you are receiving this medication. You may need blood work while taking this medication. This medication may cause serious skin reactions. They can happen weeks to months after starting the medication. Contact your care team right away if you notice fevers or flu-like symptoms with a rash. The rash may be red or purple and then turn into blisters or peeling of the skin. You may also notice a red rash with swelling of the face, lips, or lymph nodes in your neck or under your arms. Tell your care team right away if you have any change in your eyesight. Talk to your care team if you may be pregnant.  Serious birth defects can occur if you take this medication during pregnancy and for 4 months after the last dose. You will need a negative pregnancy test before starting this medication. Contraception is recommended while taking this medication and for 4 months after the last dose. Your care team can help you find the option that works for you. Do not breastfeed while taking this medication and for 4 months after the last dose. What side effects may I notice from receiving this medication? Side effects that you should report to your care team as soon as possible: Allergic reactions--skin rash, itching, hives, swelling of the face, lips, tongue, or throat Dry cough, shortness of breath or trouble breathing Eye pain, redness, irritation, or discharge with blurry or decreased vision Heart muscle inflammation--unusual weakness or fatigue, shortness of breath, chest pain, fast or irregular heartbeat, dizziness, swelling of the ankles, feet, or hands Hormone gland problems--headache, sensitivity to light, unusual weakness or fatigue, dizziness, fast or irregular heartbeat, increased sensitivity to cold or heat, excessive sweating, constipation, hair loss, increased thirst or amount of urine, tremors or shaking, irritability Infusion reactions--chest pain, shortness of breath or trouble breathing, feeling faint or lightheaded Kidney injury (glomerulonephritis)--decrease in the amount of urine, red or dark brown urine, foamy or bubbly urine,  swelling of the ankles, hands, or feet Liver injury--right upper belly pain, loss of appetite, nausea, light-colored stool, dark yellow or brown urine, yellowing skin or eyes, unusual weakness or fatigue Pain, tingling, or numbness in the hands or feet, muscle weakness, change in vision, confusion or trouble speaking, loss of balance or coordination, trouble walking, seizures Rash, fever, and swollen lymph nodes Redness, blistering, peeling, or loosening of the skin,  including inside the mouth Sudden or severe stomach pain, bloody diarrhea, fever, nausea, vomiting Side effects that usually do not require medical attention (report to your care team if they continue or are bothersome): Bone, joint, or muscle pain Diarrhea Fatigue Loss of appetite Nausea Skin rash This list may not describe all possible side effects. Call your doctor for medical advice about side effects. You may report side effects to FDA at 1-800-FDA-1088. Where should I keep my medication? This medication is given in a hospital or clinic. It will not be stored at home. NOTE: This sheet is a summary. It may not cover all possible information. If you have questions about this medicine, talk to your doctor, pharmacist, or health care provider.  2024 Elsevier/Gold Standard (2021-10-28 00:00:00)  Paclitaxel Injection What is this medication? PACLITAXEL (PAK li TAX el) treats some types of cancer. It works by slowing down the growth of cancer cells. This medicine may be used for other purposes; ask your health care provider or pharmacist if you have questions. COMMON BRAND NAME(S): Onxol, Taxol What should I tell my care team before I take this medication? They need to know if you have any of these conditions: Heart disease Liver disease Low white blood cell levels An unusual or allergic reaction to paclitaxel, other medications, foods, dyes, or preservatives If you or your partner are pregnant or trying to get pregnant Breast-feeding How should I use this medication? This medication is injected into a vein. It is given by your care team in a hospital or clinic setting. Talk to your care team about the use of this medication in children. While it may be given to children for selected conditions, precautions do apply. Overdosage: If you think you have taken too much of this medicine contact a poison control center or emergency room at once. NOTE: This medicine is only for you. Do not  share this medicine with others. What if I miss a dose? Keep appointments for follow-up doses. It is important not to miss your dose. Call your care team if you are unable to keep an appointment. What may interact with this medication? Do not take this medication with any of the following: Live virus vaccines Other medications may affect the way this medication works. Talk with your care team about all of the medications you take. They may suggest changes to your treatment plan to lower the risk of side effects and to make sure your medications work as intended. This list may not describe all possible interactions. Give your health care provider a list of all the medicines, herbs, non-prescription drugs, or dietary supplements you use. Also tell them if you smoke, drink alcohol, or use illegal drugs. Some items may interact with your medicine. What should I watch for while using this medication? Your condition will be monitored carefully while you are receiving this medication. You may need blood work while taking this medication. This medication may make you feel generally unwell. This is not uncommon as chemotherapy can affect healthy cells as well as cancer cells. Report any side effects. Continue  your course of treatment even though you feel ill unless your care team tells you to stop. This medication can cause serious allergic reactions. To reduce the risk, your care team may give you other medications to take before receiving this one. Be sure to follow the directions from your care team. This medication may increase your risk of getting an infection. Call your care team for advice if you get a fever, chills, sore throat, or other symptoms of a cold or flu. Do not treat yourself. Try to avoid being around people who are sick. This medication may increase your risk to bruise or bleed. Call your care team if you notice any unusual bleeding. Be careful brushing or flossing your teeth or using a  toothpick because you may get an infection or bleed more easily. If you have any dental work done, tell your dentist you are receiving this medication. Talk to your care team if you may be pregnant. Serious birth defects can occur if you take this medication during pregnancy. Talk to your care team before breastfeeding. Changes to your treatment plan may be needed. What side effects may I notice from receiving this medication? Side effects that you should report to your care team as soon as possible: Allergic reactions--skin rash, itching, hives, swelling of the face, lips, tongue, or throat Heart rhythm changes--fast or irregular heartbeat, dizziness, feeling faint or lightheaded, chest pain, trouble breathing Increase in blood pressure Infection--fever, chills, cough, sore throat, wounds that don't heal, pain or trouble when passing urine, general feeling of discomfort or being unwell Low blood pressure--dizziness, feeling faint or lightheaded, blurry vision Low red blood cell level--unusual weakness or fatigue, dizziness, headache, trouble breathing Painful swelling, warmth, or redness of the skin, blisters or sores at the infusion site Pain, tingling, or numbness in the hands or feet Slow heartbeat--dizziness, feeling faint or lightheaded, confusion, trouble breathing, unusual weakness or fatigue Unusual bruising or bleeding Side effects that usually do not require medical attention (report to your care team if they continue or are bothersome): Diarrhea Hair loss Joint pain Loss of appetite Muscle pain Nausea Vomiting This list may not describe all possible side effects. Call your doctor for medical advice about side effects. You may report side effects to FDA at 1-800-FDA-1088. Where should I keep my medication? This medication is given in a hospital or clinic. It will not be stored at home. NOTE: This sheet is a summary. It may not cover all possible information. If you have questions  about this medicine, talk to your doctor, pharmacist, or health care provider.  2024 Elsevier/Gold Standard (2021-11-04 00:00:00)  Carboplatin Injection What is this medication? CARBOPLATIN (KAR boe pla tin) treats some types of cancer. It works by slowing down the growth of cancer cells. This medicine may be used for other purposes; ask your health care provider or pharmacist if you have questions. COMMON BRAND NAME(S): Paraplatin What should I tell my care team before I take this medication? They need to know if you have any of these conditions: Blood disorders Hearing problems Kidney disease Recent or ongoing radiation therapy An unusual or allergic reaction to carboplatin, cisplatin, other medications, foods, dyes, or preservatives Pregnant or trying to get pregnant Breast-feeding How should I use this medication? This medication is injected into a vein. It is given by your care team in a hospital or clinic setting. Talk to your care team about the use of this medication in children. Special care may be needed. Overdosage: If you  think you have taken too much of this medicine contact a poison control center or emergency room at once. NOTE: This medicine is only for you. Do not share this medicine with others. What if I miss a dose? Keep appointments for follow-up doses. It is important not to miss your dose. Call your care team if you are unable to keep an appointment. What may interact with this medication? Medications for seizures Some antibiotics, such as amikacin, gentamicin, neomycin, streptomycin, tobramycin Vaccines This list may not describe all possible interactions. Give your health care provider a list of all the medicines, herbs, non-prescription drugs, or dietary supplements you use. Also tell them if you smoke, drink alcohol, or use illegal drugs. Some items may interact with your medicine. What should I watch for while using this medication? Your condition will be  monitored carefully while you are receiving this medication. You may need blood work while taking this medication. This medication may make you feel generally unwell. This is not uncommon, as chemotherapy can affect healthy cells as well as cancer cells. Report any side effects. Continue your course of treatment even though you feel ill unless your care team tells you to stop. In some cases, you may be given additional medications to help with side effects. Follow all directions for their use. This medication may increase your risk of getting an infection. Call your care team for advice if you get a fever, chills, sore throat, or other symptoms of a cold or flu. Do not treat yourself. Try to avoid being around people who are sick. Avoid taking medications that contain aspirin, acetaminophen, ibuprofen, naproxen, or ketoprofen unless instructed by your care team. These medications may hide a fever. Be careful brushing or flossing your teeth or using a toothpick because you may get an infection or bleed more easily. If you have any dental work done, tell your dentist you are receiving this medication. Talk to your care team if you wish to become pregnant or think you might be pregnant. This medication can cause serious birth defects. Talk to your care team about effective forms of contraception. Do not breast-feed while taking this medication. What side effects may I notice from receiving this medication? Side effects that you should report to your care team as soon as possible: Allergic reactions--skin rash, itching, hives, swelling of the face, lips, tongue, or throat Infection--fever, chills, cough, sore throat, wounds that don't heal, pain or trouble when passing urine, general feeling of discomfort or being unwell Low red blood cell level--unusual weakness or fatigue, dizziness, headache, trouble breathing Pain, tingling, or numbness in the hands or feet, muscle weakness, change in vision, confusion  or trouble speaking, loss of balance or coordination, trouble walking, seizures Unusual bruising or bleeding Side effects that usually do not require medical attention (report to your care team if they continue or are bothersome): Hair loss Nausea Unusual weakness or fatigue Vomiting This list may not describe all possible side effects. Call your doctor for medical advice about side effects. You may report side effects to FDA at 1-800-FDA-1088. Where should I keep my medication? This medication is given in a hospital or clinic. It will not be stored at home. NOTE: This sheet is a summary. It may not cover all possible information. If you have questions about this medicine, talk to your doctor, pharmacist, or health care provider.  2024 Elsevier/Gold Standard (2021-10-07 00:00:00)

## 2024-04-21 NOTE — Research (Signed)
 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 Confirmed patient does not have wounds, sores, or lesions to extremities. Patient has not had any vaccinations since last visit.    ADVERSE EVENTS: Solicited AEs reviewed with patient.  She also denies any neuropathy symptoms. She does report occasional tingling in fingertips and toes described as feeling like her hands and/or feet fall asleep at times and the feeling is relieved with repositioning. See AE table below.    BASELINE AEs Adverse Event CTCAE Grade Onset date Resolved date Relationship to Study Intervention Action Taken Comments  Skin atrophy (solicited) 0            Skin hyperpigmentation (solicited) 0            Skin hypopigmentation (solicited) 0            Skin induration (solicited) 0            Skin ulceration (solicited) 0            Rash maculopapular (solicited) 0            Nail changes (solicited) 0            Cold intolerance (solicited) (general disorders and administration site conditions- other) 0            Frostbite (solicited) (skin and subcutaneous tissue disorders- other) 0                STUDY INTERVENTION & TOLERABILITY ASSESSMENTS: 12:40 pm- Device wraps applied; pre-treatment started set on Arm 2 Continuous Compression, pressure setting 25 mmHG. 12:47 pm- Required 5-15 minute Tolerability check- pt states tolerable to compression. 1:23 pm- Taxane infusion start time. Device moved to treatment phase. 1:40 pm Required 60 minute Tolerability check- pt states tolerable to compression. 2:40 pm Required 60 minute Tolerability check- pt states wraps are not tolerable. She says the compression part is tolerable but being wrapped up feels uncomfortable and claustrophobic. She does not think decreasing the  compression will help as it feels like the wraps are uncomfortable regardless of compression.  Patient was unwrapped, took a bathroom break and then decided to stay unwrapped for the rest of her treatment today. Confirmed skin is intact and no wounds or sores. She is undecided at this time if she will continue with the device, but she may try the device again next week.   PLAN:  The patient was thanked for their time and participation in this study. She understands research nurse will meet with her again at next scheduled infusion appt 04/28/24. We can discuss if she wants to continue with the study device or not at that time. She verbalized understanding. Patient has been provided direct contact information and is encouraged to contact this Nurse for any needs or questions.   Cherylyn Hoard, BSN, RN, [employer] Research Nurse II [phone] 04/21/2024

## 2024-04-22 LAB — T4: T4, Total: 6.2 ug/dL (ref 4.5–12.0)

## 2024-04-23 ENCOUNTER — Other Ambulatory Visit: Payer: Self-pay

## 2024-04-23 ENCOUNTER — Encounter: Payer: Self-pay | Admitting: Hematology and Oncology

## 2024-04-23 NOTE — Assessment & Plan Note (Signed)
 03/30/2024:Screening mammogram detected right upper outer quadrant mass 2.6 cm by mammogram, by ultrasound it measured 2.2 cm at 10 o'clock position, axilla negative.  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 After reviewing the pathology in detail, we proceeded to discuss the different treatment options between surgery, radiation, chemotherapy, antiestrogen therapies.  Treatment plan: Neoadjuvant chemotherapy with Taxol carbo Keytruda followed by Adriamycin Cytoxan Keytruda followed by Sharion maintenance for 1 year Breast conserving surgery with sentinel lymph node biopsy Adjuvant radiation  Patient will also be counseled for Scarlet clinical trial Current treatment:  Keytruda, Taxol, Carbo

## 2024-04-24 ENCOUNTER — Telehealth: Payer: Self-pay

## 2024-04-24 NOTE — Telephone Encounter (Signed)
 Anhaiser, Almarie BROCKS, RN  P Onc Triage Nurse [facility] Caller: Unspecified (3 days ago,  3:52 PM) Tolerated her first time keytruda, taxol, carboplatin.

## 2024-04-25 ENCOUNTER — Encounter (HOSPITAL_COMMUNITY): Payer: Self-pay | Admitting: Emergency Medicine

## 2024-04-25 ENCOUNTER — Telehealth: Payer: Self-pay

## 2024-04-25 ENCOUNTER — Emergency Department (HOSPITAL_COMMUNITY)
Admission: EM | Admit: 2024-04-25 | Discharge: 2024-04-25 | Disposition: A | Discharge status: Home / Self Care | Attending: Emergency Medicine | Admitting: Emergency Medicine

## 2024-04-25 ENCOUNTER — Emergency Department (HOSPITAL_COMMUNITY)

## 2024-04-25 ENCOUNTER — Other Ambulatory Visit: Payer: Self-pay

## 2024-04-25 DIAGNOSIS — E876 Hypokalemia: Secondary | ICD-10-CM | POA: Insufficient documentation

## 2024-04-25 DIAGNOSIS — R1012 Left upper quadrant pain: Secondary | ICD-10-CM | POA: Diagnosis present

## 2024-04-25 DIAGNOSIS — K5903 Drug induced constipation: Secondary | ICD-10-CM | POA: Insufficient documentation

## 2024-04-25 DIAGNOSIS — Z853 Personal history of malignant neoplasm of breast: Secondary | ICD-10-CM | POA: Insufficient documentation

## 2024-04-25 LAB — COMPREHENSIVE METABOLIC PANEL WITH GFR
ALT: 20 U/L (ref 0–44)
AST: 17 U/L (ref 15–41)
Albumin: 3.4 g/dL — ABNORMAL LOW (ref 3.5–5.0)
Alkaline Phosphatase: 51 U/L (ref 38–126)
Anion gap: 9 (ref 5–15)
BUN: 20 mg/dL (ref 6–20)
CO2: 27 mmol/L (ref 22–32)
Calcium: 8.5 mg/dL — ABNORMAL LOW (ref 8.9–10.3)
Chloride: 100 mmol/L (ref 98–111)
Creatinine, Ser: 1.5 mg/dL — ABNORMAL HIGH (ref 0.44–1.00)
GFR, Estimated: 42 mL/min — ABNORMAL LOW (ref >60)
Glucose, Bld: 91 mg/dL (ref 70–99)
Potassium: 3.1 mmol/L — ABNORMAL LOW (ref 3.5–5.1)
Sodium: 136 mmol/L (ref 135–145)
Total Bilirubin: 0.4 mg/dL (ref 0.0–1.2)
Total Protein: 6.5 g/dL (ref 6.5–8.1)

## 2024-04-25 LAB — URINALYSIS, ROUTINE W REFLEX MICROSCOPIC
Bilirubin Urine: NEGATIVE
Glucose, UA: NEGATIVE mg/dL
Ketones, ur: NEGATIVE mg/dL
Leukocytes,Ua: NEGATIVE
Nitrite: NEGATIVE
Protein, ur: NEGATIVE mg/dL
Specific Gravity, Urine: 1.018 (ref 1.005–1.030)
pH: 5 (ref 5.0–8.0)

## 2024-04-25 LAB — CBC
HCT: 35.9 % — ABNORMAL LOW (ref 36.0–46.0)
Hemoglobin: 12.5 g/dL (ref 12.0–15.0)
MCH: 30.5 pg (ref 26.0–34.0)
MCHC: 34.8 g/dL (ref 30.0–36.0)
MCV: 87.6 fL (ref 80.0–100.0)
Platelets: 221 K/uL (ref 150–400)
RBC: 4.1 MIL/uL (ref 3.87–5.11)
RDW: 12.9 % (ref 11.5–15.5)
WBC: 8.8 K/uL (ref 4.0–10.5)
nRBC: 0 % (ref 0.0–0.2)

## 2024-04-25 LAB — LIPASE, BLOOD: Lipase: 31 U/L (ref 11–51)

## 2024-04-25 MED ORDER — DOCUSATE SODIUM 100 MG PO CAPS: ORAL_CAPSULE | ORAL | 0 refills | Status: AC

## 2024-04-25 MED ORDER — LACTATED RINGERS IV BOLUS
1000.0000 mL | Freq: Once | INTRAVENOUS | Status: AC
  Administered 2024-04-25: 1000 mL via INTRAVENOUS

## 2024-04-25 MED ORDER — POLYETHYLENE GLYCOL 3350 17 GM/SCOOP PO POWD: 17.0000 g | Freq: Two times a day (BID) | ORAL | 0 refills | Status: AC

## 2024-04-25 MED ORDER — IOHEXOL 350 MG/ML SOLN
75.0000 mL | Freq: Once | INTRAVENOUS | Status: AC | PRN
  Administered 2024-04-25: 75 mL via INTRAVENOUS

## 2024-04-25 NOTE — Discharge Instructions (Signed)
 Please discuss the following findings with your doctor.  You may need to follow-up with gastroenterology to have an endoscopy.  You should also have an MRI of your liver in the next 3 to 6 months.  Please take 1 capful of MiraLAX daily for the next 2 weeks.  Please take 2 tablets of Colace daily until you have a normal bowel movement, then take 1 daily until your bowel movements are soft and regular.  IMPRESSION:  1. Thickened gastric antral folds consistent with gastritis or peptic ulcer  disease. Consider clinical management and endoscopic evaluation as indicated.  2. Mild fecal stasis in the ascending, transverse, and descending colon without  evidence of colitis or diverticulitis.  3. There are scattered tiny hypodensities in the liver . According to available  information, she is being treated with chemotherapy for right breast cancer  diagnosed last month. Consider obtaining a 3 to 81-month follow-up MRI abdomen  without and with contrast as follow up for this finding.  4. No acute findings.  5. Small ovarian cysts. No specific imaging follow-up recommended.

## 2024-04-25 NOTE — ED Triage Notes (Addendum)
 Patient had chemo on Friday, had a BM afterwards and has not had a BM since.  Patient states that she feels like she has stool in her rectum but can't pass it. Patient has tried miralax and coffee with slight relief.  Patient also endorses belching and lower abdominal pain also since Friday.

## 2024-04-25 NOTE — ED Provider Notes (Signed)
 [facility] DEPT [facility] Emergency Department Provider Note MRN:  [id number]  Arrival date & time: 04/25/24     Chief Complaint   Constipation and Abdominal Pain   History of Present Illness   Angela [place] is a 48 y.o. year-old female presents to the ED with chief complaint of constipation.  Has hx of breast cancer and is on chemo.  States that she had a BM on Friday, but hasn't had one since then. She states that she feels like she has a stool ball at her left upper abdomen that won't move down.  Has tried water, miralax, sprite, coffee, but hasn't been able to have a BM.  States that she has some discomfort in the left lower abdomen.  Has been taking zofran and compazine for the chemo.  Isn't taking tramadol anymore.  History provided by patient.   Review of Systems  Pertinent positive and negative review of systems noted in HPI.    Physical Exam   Vitals:   04/25/24 0400 04/25/24 0402  BP: (!) 152/72   Pulse: 65   Resp: 13   Temp:  98.4 F (36.9 C)  SpO2: 98%     CONSTITUTIONAL:  non toxic-appearing, NAD NEURO:  Alert and oriented x 3, CN 3-12 grossly intact EYES:  eyes equal and reactive ENT/NECK:  Supple, no stridor  CARDIO:  normal rate, regular rhythm, appears well-perfused  PULM:  No respiratory distress,  GI/GU:  non-distended, no focal tenderness MSK/SPINE:  No gross deformities, no edema, moves all extremities  SKIN:  no rash, atraumatic   *Additional and/or pertinent findings included in MDM below  Diagnostic and Interventional Summary    EKG Interpretation Date/Time:    Ventricular Rate:    PR Interval:    QRS Duration:    QT Interval:    QTC Calculation:   R Axis:      Text Interpretation:         Labs Reviewed  COMPREHENSIVE METABOLIC PANEL WITH GFR - Abnormal; Notable for the following components:      Result Value   Potassium 3.1 (*)    Creatinine, Ser 1.50 (*)    Calcium 8.5 (*)    Albumin 3.4 (*)    GFR, Estimated  42 (*)    All other components within normal limits  CBC - Abnormal; Notable for the following components:   HCT 35.9 (*)    All other components within normal limits  URINALYSIS, ROUTINE W REFLEX MICROSCOPIC - Abnormal; Notable for the following components:   APPearance CLOUDY (*)    Hgb urine dipstick MODERATE (*)    Bacteria, UA MANY (*)    All other components within normal limits  LIPASE, BLOOD    CT ABDOMEN PELVIS W CONTRAST  Final Result      Medications  lactated ringers bolus 1,000 mL (0 mLs Intravenous Stopped 04/25/24 0450)  iohexol (OMNIPAQUE) 350 MG/ML injection 75 mL (75 mLs Intravenous Contrast Given 04/25/24 0324)     Procedures  /  Critical Care Procedures  ED Course and Medical Decision Making  I have reviewed the triage vital signs, the nursing notes, and pertinent available records from the EMR.  Social Determinants Affecting Complexity of Care: Patient has no clinically significant social determinants affecting this chief complaint..   ED Course:    Medical Decision Making Patient here for feeling like she is constipated.  She states she has not had a normal bowel movement for the past few days.  She is  on chemotherapy for breast cancer.  She is taking several different antiemetics.  She states that she feels like she has stool balls in her left upper abdomen.  Labs are fairly reassuring.  Will check CT abdomen/pelvis to rule out colitis or diverticulitis.  CT shows fecal stasis in the ascending, transverse, and descending colon.  We discussed treatment with MiraLAX and Colace and increasing fluids.  She does not appear in any distress.  She did have incidental abnormal findings in the liver, which we will require follow-up in 3 to 6 months with MRI.  I discussed these results with her.  Also discussed that she might benefit from seeing GI.  Patient understands and agrees with discharge and follow-up plan.  Amount and/or Complexity of Data Reviewed Labs:  ordered. Radiology: ordered.  Risk OTC drugs. Prescription drug management.         Consultants: No consultations were needed in caring for this patient.   Treatment and Plan: I considered admission due to patient's initial presentation, but after considering the examination and diagnostic results, patient will not require admission and can be discharged with outpatient follow-up.    Final Clinical Impressions(s) / ED Diagnoses     ICD-10-CM   1. Drug-induced constipation  K59.03       ED Discharge Orders          Ordered    polyethylene glycol powder (GLYCOLAX/MIRALAX) 17 GM/SCOOP powder  2 times daily        04/25/24 0415    docusate sodium (COLACE) 100 MG capsule        04/25/24 0415              Discharge Instructions Discussed with and Provided to Patient:     Discharge Instructions      Please discuss the following findings with your doctor.  You may need to follow-up with gastroenterology to have an endoscopy.  You should also have an MRI of your liver in the next 3 to 6 months.  Please take 1 capful of MiraLAX daily for the next 2 weeks.  Please take 2 tablets of Colace daily until you have a normal bowel movement, then take 1 daily until your bowel movements are soft and regular.  IMPRESSION:  1. Thickened gastric antral folds consistent with gastritis or peptic ulcer  disease. Consider clinical management and endoscopic evaluation as indicated.  2. Mild fecal stasis in the ascending, transverse, and descending colon without  evidence of colitis or diverticulitis.  3. There are scattered tiny hypodensities in the liver . According to available  information, she is being treated with chemotherapy for right breast cancer  diagnosed last month. Consider obtaining a 3 to 67-month follow-up MRI abdomen  without and with contrast as follow up for this finding.  4. No acute findings.  5. Small ovarian cysts. No specific imaging follow-up recommended.         Vicky Charleston, PA-C 04/25/24 0542    Carita Senior, MD 04/25/24 [id number]

## 2024-04-25 NOTE — Telephone Encounter (Signed)
 Notified the pt regarding her Disability forms being completed,faxed,and confirmation received. Pt verbalized understanding. Pt will pick up her copy.

## 2024-04-26 ENCOUNTER — Encounter: Payer: Self-pay | Admitting: Hematology and Oncology

## 2024-04-26 ENCOUNTER — Encounter: Payer: Self-pay | Admitting: *Deleted

## 2024-04-26 ENCOUNTER — Encounter: Payer: Self-pay | Admitting: Genetic Counselor

## 2024-04-26 ENCOUNTER — Telehealth: Payer: Self-pay | Admitting: Genetic Counselor

## 2024-04-26 ENCOUNTER — Other Ambulatory Visit: Payer: Self-pay

## 2024-04-26 ENCOUNTER — Encounter: Payer: Self-pay | Admitting: Licensed Clinical Social Worker

## 2024-04-26 DIAGNOSIS — Z170 Estrogen receptor positive status [ER+]: Secondary | ICD-10-CM

## 2024-04-26 DIAGNOSIS — Z1379 Encounter for other screening for genetic and chromosomal anomalies: Secondary | ICD-10-CM | POA: Insufficient documentation

## 2024-04-26 NOTE — Progress Notes (Signed)
 [facility] CSW Progress Note  Visual Merchandiser received notice that pt was approved for assistance through [employer].       Follow Up Plan:  CSW will see patient on 10/31 in infusion to follow-up on other resources    Angela [place] E Brinley Treanor, LCSW Clinical Social Worker [facility]

## 2024-04-26 NOTE — Telephone Encounter (Signed)
 I contacted  Angela [place] to discuss her genetic testing results. No pathogenic variants were identified in the 40 genes analyzed. Discussed that we do not know why she has Breast cancer or why there is cancer in the family. It could be due to a different gene that we are not testing, or maybe our current technology may not be able to pick something up.  It will be important for her to keep in contact with genetics to keep up with whether additional testing may be needed.Detailed clinic note to follow.   The test report will be scanned into EPIC and will be located under the Molecular Pathology section of the Results Review tab.  A portion of the result report is included below for reference.

## 2024-04-27 ENCOUNTER — Encounter: Payer: Self-pay | Admitting: Hematology and Oncology

## 2024-04-27 ENCOUNTER — Other Ambulatory Visit (HOSPITAL_COMMUNITY): Payer: Self-pay

## 2024-04-27 ENCOUNTER — Inpatient Hospital Stay

## 2024-04-27 ENCOUNTER — Encounter: Payer: Self-pay | Admitting: Adult Health

## 2024-04-27 ENCOUNTER — Inpatient Hospital Stay (HOSPITAL_BASED_OUTPATIENT_CLINIC_OR_DEPARTMENT_OTHER): Admitting: Adult Health

## 2024-04-27 VITALS — BP 133/89 | HR 81 | Temp 97.2°F | Resp 16 | Wt 210.5 lb

## 2024-04-27 DIAGNOSIS — Z170 Estrogen receptor positive status [ER+]: Secondary | ICD-10-CM

## 2024-04-27 DIAGNOSIS — Z5112 Encounter for antineoplastic immunotherapy: Secondary | ICD-10-CM | POA: Diagnosis not present

## 2024-04-27 DIAGNOSIS — C50411 Malignant neoplasm of upper-outer quadrant of right female breast: Secondary | ICD-10-CM

## 2024-04-27 LAB — CBC WITH DIFFERENTIAL (CANCER CENTER ONLY)
Abs Immature Granulocytes: 0.01 K/uL (ref 0.00–0.07)
Basophils Absolute: 0 K/uL (ref 0.0–0.1)
Basophils Relative: 0 %
Eosinophils Absolute: 0.2 K/uL (ref 0.0–0.5)
Eosinophils Relative: 5 %
HCT: 34.4 % — ABNORMAL LOW (ref 36.0–46.0)
Hemoglobin: 12.5 g/dL (ref 12.0–15.0)
Immature Granulocytes: 0 %
Lymphocytes Relative: 56 %
Lymphs Abs: 2.5 K/uL (ref 0.7–4.0)
MCH: 30.9 pg (ref 26.0–34.0)
MCHC: 36.3 g/dL — ABNORMAL HIGH (ref 30.0–36.0)
MCV: 84.9 fL (ref 80.0–100.0)
Monocytes Absolute: 0.1 K/uL (ref 0.1–1.0)
Monocytes Relative: 2 %
Neutro Abs: 1.6 K/uL — ABNORMAL LOW (ref 1.7–7.7)
Neutrophils Relative %: 37 %
Platelet Count: 235 K/uL (ref 150–400)
RBC: 4.05 MIL/uL (ref 3.87–5.11)
RDW: 12.9 % (ref 11.5–15.5)
WBC Count: 4.5 K/uL (ref 4.0–10.5)
nRBC: 0 % (ref 0.0–0.2)

## 2024-04-27 LAB — CMP (CANCER CENTER ONLY)
ALT: 18 U/L (ref 0–44)
AST: 11 U/L — ABNORMAL LOW (ref 15–41)
Albumin: 3.9 g/dL (ref 3.5–5.0)
Alkaline Phosphatase: 49 U/L (ref 38–126)
Anion gap: 7 (ref 5–15)
BUN: 16 mg/dL (ref 6–20)
CO2: 29 mmol/L (ref 22–32)
Calcium: 9.1 mg/dL (ref 8.9–10.3)
Chloride: 102 mmol/L (ref 98–111)
Creatinine: 1.21 mg/dL — ABNORMAL HIGH (ref 0.44–1.00)
GFR, Estimated: 55 mL/min — ABNORMAL LOW (ref >60)
Glucose, Bld: 109 mg/dL — ABNORMAL HIGH (ref 70–99)
Potassium: 3.4 mmol/L — ABNORMAL LOW (ref 3.5–5.1)
Sodium: 138 mmol/L (ref 135–145)
Total Bilirubin: 0.9 mg/dL (ref 0.0–1.2)
Total Protein: 7 g/dL (ref 6.5–8.1)

## 2024-04-27 MED ORDER — OMEPRAZOLE 40 MG PO CPDR
40.0000 mg | DELAYED_RELEASE_CAPSULE | Freq: Every day | ORAL | 1 refills | Status: DC
  Filled 2024-04-27: qty 30, 30d supply, fill #0
  Filled 2024-05-26: qty 30, 30d supply, fill #1

## 2024-04-28 ENCOUNTER — Other Ambulatory Visit (HOSPITAL_COMMUNITY): Payer: Self-pay

## 2024-04-28 ENCOUNTER — Ambulatory Visit (HOSPITAL_COMMUNITY)

## 2024-04-28 ENCOUNTER — Inpatient Hospital Stay

## 2024-04-28 ENCOUNTER — Encounter: Payer: Self-pay | Admitting: Hematology and Oncology

## 2024-04-28 ENCOUNTER — Encounter: Payer: Self-pay | Admitting: Adult Health

## 2024-04-28 ENCOUNTER — Inpatient Hospital Stay: Admitting: Licensed Clinical Social Worker

## 2024-04-28 ENCOUNTER — Encounter: Payer: Self-pay | Admitting: *Deleted

## 2024-04-28 VITALS — BP 108/69 | HR 84 | Temp 98.9°F | Resp 16 | Wt 211.5 lb

## 2024-04-28 DIAGNOSIS — Z170 Estrogen receptor positive status [ER+]: Secondary | ICD-10-CM

## 2024-04-28 DIAGNOSIS — Z5112 Encounter for antineoplastic immunotherapy: Secondary | ICD-10-CM | POA: Diagnosis not present

## 2024-04-28 MED ORDER — PALONOSETRON HCL INJECTION 0.25 MG/5ML
0.2500 mg | Freq: Once | INTRAVENOUS | Status: AC
  Administered 2024-04-28: 0.25 mg via INTRAVENOUS
  Filled 2024-04-28: qty 5

## 2024-04-28 MED ORDER — DEXAMETHASONE SOD PHOSPHATE PF 10 MG/ML IJ SOLN
10.0000 mg | Freq: Once | INTRAMUSCULAR | Status: AC
  Administered 2024-04-28: 10 mg via INTRAVENOUS

## 2024-04-28 MED ORDER — FAMOTIDINE IN NACL 20-0.9 MG/50ML-% IV SOLN
20.0000 mg | Freq: Once | INTRAVENOUS | Status: AC
  Administered 2024-04-28: 20 mg via INTRAVENOUS
  Filled 2024-04-28: qty 50

## 2024-04-28 MED ORDER — SODIUM CHLORIDE 0.9 % IV SOLN: INTRAVENOUS | Status: DC

## 2024-04-28 MED ORDER — SODIUM CHLORIDE 0.9 % IV SOLN
80.0000 mg/m2 | Freq: Once | INTRAVENOUS | Status: AC
  Administered 2024-04-28: 168 mg via INTRAVENOUS
  Filled 2024-04-28: qty 28

## 2024-04-28 MED ORDER — SODIUM CHLORIDE 0.9 % IV SOLN: 192.9000 mg | Freq: Once | INTRAVENOUS | Status: DC

## 2024-04-28 MED ORDER — SODIUM CHLORIDE 0.9 % IV SOLN
160.0000 mg | Freq: Once | INTRAVENOUS | Status: AC
  Administered 2024-04-28: 160 mg via INTRAVENOUS
  Filled 2024-04-28: qty 16

## 2024-04-28 MED ORDER — DIPHENHYDRAMINE HCL 50 MG/ML IJ SOLN
50.0000 mg | Freq: Once | INTRAMUSCULAR | Status: AC
  Administered 2024-04-28: 50 mg via INTRAVENOUS
  Filled 2024-04-28: qty 1

## 2024-04-28 NOTE — Progress Notes (Signed)
 [facility] Cancer Follow up:    Angela Nell SAILOR, FNP [address] [place] [address]   DIAGNOSIS:  Cancer Staging  Malignant neoplasm of upper-outer quadrant of right breast in female, estrogen receptor positive (HCC) Staging form: Breast, AJCC 8th Edition - Clinical: Stage IIB (cT2, cN0, cM0, G3, ER-, PR-, HER2-) - Signed by Odean Potts, MD on 04/05/2024 Stage prefix: Initial diagnosis Histologic grading system: 3 grade system    SUMMARY OF ONCOLOGIC HISTORY: Oncology History  Malignant neoplasm of upper-outer quadrant of right breast in female, estrogen receptor positive (HCC)  03/30/2024 Initial Diagnosis   Screening mammogram detected right upper outer quadrant mass 2.6 cm by mammogram, by ultrasound it measured 2.2 cm at 10 o'clock position, axilla negative.  Left breast: Cysts: Aspirated Biopsy: Grade 3 IDC ER 2%, PR 0%, Ki67 60%, HER2 negative   04/05/2024 Cancer Staging   Staging form: Breast, AJCC 8th Edition - Clinical: Stage IIB (cT2, cN0, cM0, G3, ER-, PR-, HER2-) - Signed by Odean Potts, MD on 04/05/2024 Stage prefix: Initial diagnosis Histologic grading system: 3 grade system   04/15/2024 Genetic Testing   Negative genetic testing. Report date: 04/15/2024  The [facility] CancerNext+RNAinsight Panel includes sequencing, rearrangement analysis, and RNA analysis for the following 40 genes: APC, ATM, BAP1, BARD1, BMPR1A, BRCA1, BRCA2, BRIP1, CDH1, CDKN2A, CHEK2, FH, FLCN, MET, MLH1, MSH2, MSH6, MUTYH, NF1, NTHL1, PALB2, PMS2, PTEN, RAD51C, RAD51D, RPS20, SMAD4, STK11, TP53, TSC1, TSC2 and VHL (sequencing and deletion/duplication); AXIN2, HOXB13, MBD4, MSH3, POLD1 and POLE (sequencing only); EPCAM and GREM1 (deletion/duplication only). RNA data is routinely analyzed for use in variant interpretation for all genes.    04/21/2024 -  Chemotherapy   Patient is on Treatment Plan : BREAST Pembrolizumab (200) D1 + Carboplatin (1.5) D1,8,15 +  Paclitaxel (80) D1,8,15 q21d X 4 cycles / Pembrolizumab (200) D1 + AC D1 q21d x 4 cycles       CURRENT THERAPY: Kaytruda, Taxol/Carbo  INTERVAL HISTORY: Discussed the use of AI scribe software for clinical note transcription with the patient, who gave verbal consent to proceed.  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  She is undergoing treatment with Keytruda, Taxol, and Carboplatin. A recent ER visit was prompted by severe constipation, with a CT scan revealing fecal stasis in the colon but no colitis or diverticulitis. Tiny hypodensities in the liver were noted, with a recommendation for MRI follow-up.  She experiences mild fatigue and occasional nocturnal nerve sensations as side effects of her cancer treatment. She uses cetirizine for symptom relief. After severe constipation, she was treated in the ER and now has regular bowel movements with daily Miralax use.  She associates the constipation with recent surgery and medication changes. She manages heartburn with dietary adjustments and increased fruit intake. She uses Pedialyte and electrolyte solutions to alleviate weakness.   Patient Active Problem List   Diagnosis Date Noted   Genetic testing 04/26/2024   Port-A-Cath in place 04/21/2024   Family history of brain cancer    Family history of breast cancer    Family history of ovarian cancer    Breast cancer (HCC)    Malignant neoplasm of upper-outer quadrant of right breast in female, estrogen receptor positive (HCC) 04/03/2024    is allergic to penicillins.  MEDICAL HISTORY: Past Medical History:  Diagnosis Date   Anxiety  Breast cancer (HCC)    hx right breast ca in 02/2024   Family history of brain cancer    Father at 74   Family history of breast cancer    maternal grandmother   Family history of ovarian cancer    Paternal 1st  Cousin at 13   HLD (hyperlipidemia)    Hypertension    Seasonal allergies     SURGICAL HISTORY: Past Surgical History:  Procedure Laterality Date   ABDOMINOPLASTY     HERNIA REPAIR     as a child 62 -50yrs old   PORTACATH PLACEMENT N/A 04/12/2024   Procedure: INSERTION, TUNNELED CENTRAL VENOUS DEVICE, WITH PORT;  Surgeon: Vernetta Berg, MD;  Location: [facility] OR;  Service: General;  Laterality: N/A;  PORT PLACEMENT WITH ULTRASOUND GUIDANCE   WISDOM TOOTH EXTRACTION      SOCIAL HISTORY: Social History   Socioeconomic History   Marital status: Legally Separated    Spouse name: Not on file   Number of children: Not on file   Years of education: Not on file   Highest education level: Not on file  Occupational History   Not on file  Tobacco Use   Smoking status: Former    Current packs/day: 0.25    Types: Cigarettes   Smokeless tobacco: Never   Tobacco comments:    Quit smoking 09/2023  Vaping Use   Vaping status: Former  Substance and Sexual Activity   Alcohol use: Yes    Alcohol/week: 1.0 - 2.0 standard drink of alcohol    Types: 1 - 2 Standard drinks or equivalent per week    Comment: wine/liquor   Drug use: Not on file    Comment: CBD Gummies   Sexual activity: Yes    Birth control/protection: Post-menopausal  Other Topics Concern   Not on file  Social History Narrative   Not on file   Social Drivers of Health   Financial Resource Strain: Not on file  Food Insecurity: No Food Insecurity (04/05/2024)   Hunger Vital Sign    Worried About Running Out of Food in the Last Year: Never true    Ran Out of Food in the Last Year: Never true  Transportation Needs: No Transportation Needs (04/05/2024)   PRAPARE - Administrator, Civil Service (Medical): No    Lack of Transportation (Non-Medical): No  Physical Activity: Not on file  Stress: Not on file  Social Connections: Not on file  Intimate Partner Violence: Not At Risk (04/05/2024)   Humiliation, Afraid,  Rape, and Kick questionnaire    Fear of Current or Ex-Partner: No    Emotionally Abused: No    Physically Abused: No    Sexually Abused: No    FAMILY HISTORY: Family History  Problem Relation Age of Onset   Brain cancer Father 53   Breast cancer Maternal Grandmother 10   Cancer Paternal Aunt 63 - 69       Unknown Type   Ovarian cancer Paternal Cousin 62   Cancer Paternal Uncle        Unknown type    Review of Systems  Constitutional:  Negative for appetite change, chills, fatigue, fever and unexpected weight change.  HENT:   Negative for hearing loss, lump/mass and trouble swallowing.   Eyes:  Negative for eye problems and icterus.  Respiratory:  Negative for chest tightness, cough and shortness of breath.   Cardiovascular:  Negative for chest pain, leg swelling and palpitations.  Gastrointestinal:  Negative for abdominal  distention, abdominal pain, constipation, diarrhea, nausea and vomiting.  Endocrine: Negative for hot flashes.  Genitourinary:  Negative for difficulty urinating.   Musculoskeletal:  Negative for arthralgias.  Skin:  Negative for itching and rash.  Neurological:  Negative for dizziness, extremity weakness, headaches and numbness.  Hematological:  Negative for adenopathy. Does not bruise/bleed easily.  Psychiatric/Behavioral:  Negative for depression. The patient is not nervous/anxious.       PHYSICAL EXAMINATION    Vitals:   04/27/24 0950  BP: 133/89  Pulse: 81  Resp: 16  Temp: (!) 97.2 F (36.2 C)  SpO2: 100%    Physical Exam Constitutional:      General: She is not in acute distress.    Appearance: Normal appearance. She is not toxic-appearing.  HENT:     Head: Normocephalic and atraumatic.     Mouth/Throat:     Mouth: Mucous membranes are moist.     Pharynx: Oropharynx is clear. No oropharyngeal exudate or posterior oropharyngeal erythema.  Eyes:     General: No scleral icterus. Cardiovascular:     Rate and Rhythm: Normal rate and  regular rhythm.     Pulses: Normal pulses.     Heart sounds: Normal heart sounds.  Pulmonary:     Effort: Pulmonary effort is normal.     Breath sounds: Normal breath sounds.  Abdominal:     General: Abdomen is flat. Bowel sounds are normal. There is no distension.     Palpations: Abdomen is soft.     Tenderness: There is no abdominal tenderness.  Musculoskeletal:        General: No swelling.     Cervical back: Neck supple.  Lymphadenopathy:     Cervical: No cervical adenopathy.  Skin:    General: Skin is warm and dry.     Findings: No rash.  Neurological:     General: No focal deficit present.     Mental Status: She is alert.  Psychiatric:        Mood and Affect: Mood normal.        Behavior: Behavior normal.     LABORATORY DATA:  CBC    Component Value Date/Time   WBC 4.5 04/27/2024 0909   WBC 8.8 04/25/2024 0056   RBC 4.05 04/27/2024 0909   HGB 12.5 04/27/2024 0909   HCT 34.4 (L) 04/27/2024 0909   PLT 235 04/27/2024 0909   MCV 84.9 04/27/2024 0909   MCH 30.9 04/27/2024 0909   MCHC 36.3 (H) 04/27/2024 0909   RDW 12.9 04/27/2024 0909   LYMPHSABS 2.5 04/27/2024 0909   MONOABS 0.1 04/27/2024 0909   EOSABS 0.2 04/27/2024 0909   BASOSABS 0.0 04/27/2024 0909    CMP     Component Value Date/Time   NA 138 04/27/2024 0909   K 3.4 (L) 04/27/2024 0909   CL 102 04/27/2024 0909   CO2 29 04/27/2024 0909   GLUCOSE 109 (H) 04/27/2024 0909   BUN 16 04/27/2024 0909   CREATININE 1.21 (H) 04/27/2024 0909   CALCIUM 9.1 04/27/2024 0909   PROT 7.0 04/27/2024 0909   ALBUMIN 3.9 04/27/2024 0909   AST 11 (L) 04/27/2024 0909   ALT 18 04/27/2024 0909   ALKPHOS 49 04/27/2024 0909   BILITOT 0.9 04/27/2024 0909   GFRNONAA 55 (L) 04/27/2024 0909     ASSESSMENT and THERAPY PLAN:   Malignant neoplasm of upper-outer quadrant of right breast in female, estrogen receptor positive (HCC) 03/30/2024:Screening mammogram detected right upper outer quadrant mass 2.6 cm by mammogram,  by  ultrasound it measured 2.2 cm at 10 o'clock position, axilla negative.  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 After reviewing the pathology in detail, we proceeded to discuss the different treatment options between surgery, radiation, chemotherapy, antiestrogen therapies.  Treatment plan: Neoadjuvant chemotherapy with Taxol carbo Keytruda followed by Adriamycin Cytoxan Keytruda followed by Sharion maintenance for 1 year Breast conserving surgery with sentinel lymph node biopsy Adjuvant radiation  Patient will also be counseled for Scarlet clinical trial Current treatment:  Keytruda, Taxol, Carbo  Assessment and Plan Assessment & Plan Triple negative breast cancer, stage 2B Stage 2B triple negative breast cancer treated with Keytruda, Taxol, and Carboplatin. Labs stable for continuation. - Continue chemotherapy with Keytruda, Taxol, and Carboplatin.  Drug-induced constipation with recent severe fecal impaction Severe fecal impaction due to chemotherapy and medication side effects. Managed with Miralax and dose-less regimen, resulting in bowel movements. - Administer half a bottle of Mag Citrate followed by a glass of water for bowel clearance. - Continue daily Miralax. - Increase fluid intake. - Monitor bowel movements; report if no movement for three days. - Consider Senokot S- as much as 2 tab PO BID if constipation recurs.  Fatigue secondary to chemotherapy Fatigue is a common side effect of chemotherapy. - Encourage adequate rest and hydration.  Questionable peptic ulcer disease and gastritis under evaluation CT scan indicated possible peptic ulcer disease and gastritis. Risk of gastrointestinal  bleeding due to chemotherapy and steroid use. - Prescribed Omeprazole to reduce stomach acidity daily on an empty stomach. - Arrange GI follow-up post-treatment to rule out bacterial causes of ulcers.  Tiny hepatic cysts versus other liver lesions, under surveillance CT scan revealed tiny hypodensities in the liver, likely cysts. Requires follow-up imaging. - Order repeat MRI of the abdomen with and without contrast for further evaluation in 3-6 months for f/u.  Hypokalemia, mild Mild hypokalemia potentially exacerbated by recent bowel movements and chemotherapy. - Encourage intake of potassium-rich foods. - Monitor potassium levels.  RTC tomorrow for chemotherapy.    All questions were answered. The patient knows to call the clinic with any problems, questions or concerns. We can certainly see the patient much sooner if necessary.  Total encounter time:30 minutes*in face-to-face visit time, chart review, lab review, care coordination, order entry, and documentation of the encounter time.    Morna Kendall, NP 04/28/24 7:58 PM Medical Oncology and Hematology [facility] [address] [place], [place] [address] Tel. [phone]    Fax. [phone]  *Total Encounter Time as defined by the Centers for Medicare and Medicaid Services includes, in addition to the face-to-face time of a patient visit (documented in the note above) non-face-to-face time: obtaining and reviewing outside history, ordering and reviewing medications, tests or procedures, care coordination (communications with other health care professionals or caregivers) and documentation in the medical record.

## 2024-04-28 NOTE — Progress Notes (Signed)
 [facility] CSW Progress Note  Visual Merchandiser met with patient to follow-up on need for community resources.    Interventions: Updated patient on status of applications ([employer] approved; [employer] pending) Reminded pt of next steps to apply for [employer], [employer], and [employer]     Pt is waiting for her short-term disability to start, but paperwork has been submitted.  She is doing well after 1st treatment- had some constipation and fatigue, but is continuing to move and get things done at home.    Follow Up Plan:  CSW will see patient on 11/7 in infusion to follow-up on applications    Sheniqua Carolan E Jagger Beahm, LCSW Clinical Social Worker [facility]

## 2024-04-28 NOTE — Research (Signed)
 [id number], ICE COMPRESS: RANDOMIZED TRIAL OF LIMB CRYOCOMPRESSION VERSUS CONTINUOUS COMPRESSION VERSUS LOW CYCLIC COMPRESSION FOR THE PREVENTION  OF TAXANE-INDUCED PERIPHERAL NEUROPATHY  Arm 2 Continuous Compression-Treatment 2 Patient arrives today unaccompanied for the week 2 treatment visit. Confirmed patient does not have wounds, sores, or lesions to extremities. Patient has not had any vaccinations since last visit.  Patient states she is willing to try the Paxman device again with today's treatment.     ADVERSE EVENTS: Solicited AEs reviewed with patient. See AE table below for solicited AEs.  She denies any neuropathy symptoms.   Treatment 1 AEs 10/24-25-04/28/24 Adverse Event CTCAE Grade Onset date Resolved date Relationship to Study Intervention Action Taken Comments  Skin atrophy (solicited) 0            Skin hyperpigmentation (solicited) 0            Skin hypopigmentation (solicited) 0            Skin induration (solicited) 0            Skin ulceration (solicited) 0            Rash maculopapular (solicited) 0            Nail changes (solicited) 0            Cold intolerance (solicited) (general disorders and administration site conditions- other) 0            Frostbite (solicited) (skin and subcutaneous tissue disorders- other) 0                STUDY INTERVENTION & TOLERABILITY ASSESSMENTS: 12:10 pm- Device wraps applied; pre-treatment started set on Arm 2 Continuous Compression, pressure setting 25 mmHG. 12:15 pm- Required 5-15 minute Tolerability check- pt states device is tolerable to compression and wraps.  12:55 pm- Taxane infusion start time. Device display moved to treatment phase. 1:10 pm Required 60 minute Tolerability check- pt states device is tolerable to compression and wraps. 1:57 pm Taxane infusion stop time. Device display moved to post treatment phase.   2:05 pm Device paused for bathroom break x 5 minutes.  2:10 pm Required 60 minute Tolerability check- pt  states wraps are tolerable to compression.  2:40 pm  Device stopped and wraps removed. Confirmed skin is intact and no wounds or sores.   PLAN:  The patient was thanked for their time and participation in this study. She understands research nurse will meet with her again at next scheduled infusion appt 05/05/24 to apply the device. She verbalized understanding. Patient has been provided direct contact information and is encouraged to contact this Nurse for any needs or questions.   Cherylyn Hoard, BSN, RN, [employer] Research Nurse II [phone] 04/28/2024

## 2024-04-28 NOTE — Progress Notes (Addendum)
 Reduce carboplatin dose to 160 mg today and for future treatments based on current Scr (1.21) per Dr. Loretha.  Harlene Nasuti, PharmD Oncology Infusion Pharmacist 04/28/2024 11:52 AM

## 2024-04-28 NOTE — Assessment & Plan Note (Signed)
 03/30/2024:Screening mammogram detected right upper outer quadrant mass 2.6 cm by mammogram, by ultrasound it measured 2.2 cm at 10 o'clock position, axilla negative.  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 After reviewing the pathology in detail, we proceeded to discuss the different treatment options between surgery, radiation, chemotherapy, antiestrogen therapies.  Treatment plan: Neoadjuvant chemotherapy with Taxol carbo Keytruda followed by Adriamycin Cytoxan Keytruda followed by Sharion maintenance for 1 year Breast conserving surgery with sentinel lymph node biopsy Adjuvant radiation  Patient will also be counseled for Scarlet clinical trial Current treatment:  Keytruda, Taxol, Carbo

## 2024-05-01 ENCOUNTER — Encounter: Payer: Self-pay | Admitting: Hematology and Oncology

## 2024-05-04 ENCOUNTER — Other Ambulatory Visit: Payer: Self-pay | Admitting: Hematology and Oncology

## 2024-05-04 ENCOUNTER — Inpatient Hospital Stay: Attending: Hematology and Oncology

## 2024-05-04 ENCOUNTER — Encounter: Payer: Self-pay | Admitting: Adult Health

## 2024-05-04 ENCOUNTER — Inpatient Hospital Stay (HOSPITAL_BASED_OUTPATIENT_CLINIC_OR_DEPARTMENT_OTHER): Admitting: Adult Health

## 2024-05-04 ENCOUNTER — Encounter: Payer: Self-pay | Admitting: *Deleted

## 2024-05-04 VITALS — BP 127/75 | HR 78 | Temp 97.6°F | Resp 17 | Wt 218.9 lb

## 2024-05-04 DIAGNOSIS — Z5112 Encounter for antineoplastic immunotherapy: Secondary | ICD-10-CM | POA: Insufficient documentation

## 2024-05-04 DIAGNOSIS — Z79899 Other long term (current) drug therapy: Secondary | ICD-10-CM | POA: Diagnosis not present

## 2024-05-04 DIAGNOSIS — T451X5A Adverse effect of antineoplastic and immunosuppressive drugs, initial encounter: Secondary | ICD-10-CM | POA: Insufficient documentation

## 2024-05-04 DIAGNOSIS — Z1722 Progesterone receptor negative status: Secondary | ICD-10-CM | POA: Insufficient documentation

## 2024-05-04 DIAGNOSIS — Z171 Estrogen receptor negative status [ER-]: Secondary | ICD-10-CM | POA: Insufficient documentation

## 2024-05-04 DIAGNOSIS — N6002 Solitary cyst of left breast: Secondary | ICD-10-CM | POA: Diagnosis not present

## 2024-05-04 DIAGNOSIS — Z5111 Encounter for antineoplastic chemotherapy: Secondary | ICD-10-CM | POA: Diagnosis present

## 2024-05-04 DIAGNOSIS — Z170 Estrogen receptor positive status [ER+]: Secondary | ICD-10-CM

## 2024-05-04 DIAGNOSIS — C50411 Malignant neoplasm of upper-outer quadrant of right female breast: Secondary | ICD-10-CM

## 2024-05-04 DIAGNOSIS — Z1732 Human epidermal growth factor receptor 2 negative status: Secondary | ICD-10-CM | POA: Diagnosis not present

## 2024-05-04 DIAGNOSIS — Z880 Allergy status to penicillin: Secondary | ICD-10-CM | POA: Insufficient documentation

## 2024-05-04 DIAGNOSIS — Z8041 Family history of malignant neoplasm of ovary: Secondary | ICD-10-CM | POA: Diagnosis not present

## 2024-05-04 DIAGNOSIS — R5383 Other fatigue: Secondary | ICD-10-CM | POA: Diagnosis not present

## 2024-05-04 DIAGNOSIS — Z87891 Personal history of nicotine dependence: Secondary | ICD-10-CM | POA: Insufficient documentation

## 2024-05-04 DIAGNOSIS — Z803 Family history of malignant neoplasm of breast: Secondary | ICD-10-CM | POA: Diagnosis not present

## 2024-05-04 DIAGNOSIS — R12 Heartburn: Secondary | ICD-10-CM | POA: Diagnosis not present

## 2024-05-04 DIAGNOSIS — Z808 Family history of malignant neoplasm of other organs or systems: Secondary | ICD-10-CM | POA: Insufficient documentation

## 2024-05-04 DIAGNOSIS — R42 Dizziness and giddiness: Secondary | ICD-10-CM | POA: Insufficient documentation

## 2024-05-04 DIAGNOSIS — R110 Nausea: Secondary | ICD-10-CM | POA: Insufficient documentation

## 2024-05-04 DIAGNOSIS — M898X9 Other specified disorders of bone, unspecified site: Secondary | ICD-10-CM | POA: Diagnosis not present

## 2024-05-04 DIAGNOSIS — K5903 Drug induced constipation: Secondary | ICD-10-CM | POA: Insufficient documentation

## 2024-05-04 DIAGNOSIS — D701 Agranulocytosis secondary to cancer chemotherapy: Secondary | ICD-10-CM | POA: Diagnosis not present

## 2024-05-04 LAB — CBC WITH DIFFERENTIAL (CANCER CENTER ONLY)
Abs Immature Granulocytes: 0.01 K/uL (ref 0.00–0.07)
Basophils Absolute: 0 K/uL (ref 0.0–0.1)
Basophils Relative: 0 %
Eosinophils Absolute: 0.1 K/uL (ref 0.0–0.5)
Eosinophils Relative: 3 %
HCT: 32 % — ABNORMAL LOW (ref 36.0–46.0)
Hemoglobin: 11.3 g/dL — ABNORMAL LOW (ref 12.0–15.0)
Immature Granulocytes: 0 %
Lymphocytes Relative: 69 %
Lymphs Abs: 2.2 K/uL (ref 0.7–4.0)
MCH: 30.7 pg (ref 26.0–34.0)
MCHC: 35.3 g/dL (ref 30.0–36.0)
MCV: 87 fL (ref 80.0–100.0)
Monocytes Absolute: 0.1 K/uL (ref 0.1–1.0)
Monocytes Relative: 4 %
Neutro Abs: 0.7 K/uL — ABNORMAL LOW (ref 1.7–7.7)
Neutrophils Relative %: 24 %
Platelet Count: 259 K/uL (ref 150–400)
RBC: 3.68 MIL/uL — ABNORMAL LOW (ref 3.87–5.11)
RDW: 13.1 % (ref 11.5–15.5)
WBC Count: 3.1 K/uL — ABNORMAL LOW (ref 4.0–10.5)
nRBC: 0 % (ref 0.0–0.2)

## 2024-05-04 LAB — CMP (CANCER CENTER ONLY)
ALT: 20 U/L (ref 0–44)
AST: 13 U/L — ABNORMAL LOW (ref 15–41)
Albumin: 3.6 g/dL (ref 3.5–5.0)
Alkaline Phosphatase: 43 U/L (ref 38–126)
Anion gap: 3 — ABNORMAL LOW (ref 5–15)
BUN: 14 mg/dL (ref 6–20)
CO2: 27 mmol/L (ref 22–32)
Calcium: 8.6 mg/dL — ABNORMAL LOW (ref 8.9–10.3)
Chloride: 108 mmol/L (ref 98–111)
Creatinine: 1.09 mg/dL — ABNORMAL HIGH (ref 0.44–1.00)
GFR, Estimated: >60 mL/min (ref >60)
Glucose, Bld: 115 mg/dL — ABNORMAL HIGH (ref 70–99)
Potassium: 3.9 mmol/L (ref 3.5–5.1)
Sodium: 138 mmol/L (ref 135–145)
Total Bilirubin: 0.5 mg/dL (ref 0.0–1.2)
Total Protein: 6.3 g/dL — ABNORMAL LOW (ref 6.5–8.1)

## 2024-05-04 NOTE — Research (Signed)
 DCP-001: Use of a Clinical Trial Screening Tool to Address Cancer Health Disparities in the [facility] Oncology Research Program [name])  Patient Angela Marshall was identified by this nurse as a potential candidate for the above listed study.  This Clinical Research Nurse met with Angela Marshall, [id number] on 05/04/24 in a manner and location that ensures patient privacy to discuss participation in the above listed research study.  Patient is Unaccompanied. A copy of the informed consent document and separate HIPAA Authorization was provided to the patient.  Patient reads, speaks, and understands English. Patient was provided with informed consent documents.    Patient was provided the option of taking informed consent documents home to review and was encouraged to review at their convenience with their support network, including other care providers. Patient is comfortable with making a decision regarding study participation today. She is interested in participation. The informed consent and separate HIPAA Authorization was reviewed page by page.   As outlined in the informed consent form, this Nurse and [name] discussed the purpose of the research study, the investigational nature of the study, study procedures and requirements for study participation, potential risks and benefits of study participation, as well as alternatives to participation.  The patient understands participation is voluntary and they may withdraw from study participation at any time. Patient understands enrollment is pending full eligibility review.   Confidentiality and how the patient's information will be used as part of study participation were discussed.  Patient was informed there is not reimbursement provided for their time and effort spent on trial participation.    All questions were answered to patient's satisfaction.  The patient's mental and emotional status is appropriate to provide informed consent,  and the patient verbalizes an understanding of study participation.  Patient has agreed to participate in the above listed research study and has voluntarily signed the informed consent protocol version date 10/05/23 and separate HIPAA Authorization, version [place] [facility] approved date 11/16/23 on 05/04/24 at 11:45AM.  The patient was provided with a copy of the signed informed consent form and separate HIPAA Authorization for their reference.  No study specific procedures were obtained prior to the signing of the informed consent document.  Approximately 15 minutes were spent with the patient reviewing the informed consent documents.  Patient was not requested to complete a Release of Information form.   After consent, patient was interviewed to collect data for the study and answers were recorded on printed CRF.  Thanked patient for her participation in the above study.   Cherylyn Hoard, BSN, RN, [facility] Clinical Research Nurse II [phone] 05/04/2024 12:09 PM

## 2024-05-04 NOTE — Progress Notes (Signed)
 [facility] Cancer Follow up:    Angela Nell SAILOR, FNP [address] [name] [address]   DIAGNOSIS: Cancer Staging  Malignant neoplasm of upper-outer quadrant of right breast in female, estrogen receptor positive (HCC) Staging form: Breast, AJCC 8th Edition - Clinical: Stage IIB (cT2, cN0, cM0, G3, ER-, PR-, HER2-) - Signed by Odean Potts, MD on 04/05/2024 Stage prefix: Initial diagnosis Histologic grading system: 3 grade system    SUMMARY OF ONCOLOGIC HISTORY: Oncology History  Malignant neoplasm of upper-outer quadrant of right breast in female, estrogen receptor positive (HCC)  03/30/2024 Initial Diagnosis   Screening mammogram detected right upper outer quadrant mass 2.6 cm by mammogram, by ultrasound it measured 2.2 cm at 10 o'clock position, axilla negative.  Left breast: Cysts: Aspirated Biopsy: Grade 3 IDC ER 2%, PR 0%, Ki67 60%, HER2 negative   04/05/2024 Cancer Staging   Staging form: Breast, AJCC 8th Edition - Clinical: Stage IIB (cT2, cN0, cM0, G3, ER-, PR-, HER2-) - Signed by Odean Potts, MD on 04/05/2024 Stage prefix: Initial diagnosis Histologic grading system: 3 grade system   04/15/2024 Genetic Testing   Negative genetic testing. Report date: 04/15/2024  The [facility] CancerNext+RNAinsight Panel includes sequencing, rearrangement analysis, and RNA analysis for the following 40 genes: APC, ATM, BAP1, BARD1, BMPR1A, BRCA1, BRCA2, BRIP1, CDH1, CDKN2A, CHEK2, FH, FLCN, MET, MLH1, MSH2, MSH6, MUTYH, NF1, NTHL1, PALB2, PMS2, PTEN, RAD51C, RAD51D, RPS20, SMAD4, STK11, TP53, TSC1, TSC2 and VHL (sequencing and deletion/duplication); AXIN2, HOXB13, MBD4, MSH3, POLD1 and POLE (sequencing only); EPCAM and GREM1 (deletion/duplication only). RNA data is routinely analyzed for use in variant interpretation for all genes.    04/21/2024 -  Chemotherapy   Patient is on Treatment Plan : BREAST Pembrolizumab (200) D1 + Carboplatin (1.5) D1,8,15 +  Paclitaxel (80) D1,8,15 q21d X 4 cycles / Pembrolizumab (200) D1 + AC D1 q21d x 4 cycles       CURRENT THERAPY: taxol/carbo/keytruda  INTERVAL HISTORY:  Discussed the use of AI scribe software for clinical note transcription with the patient, who gave verbal consent to proceed.  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  She has completed two cycles of Taxol and Carbo, with the second Carbo dose reduced to an AUC of 1.5. Her neutrophil count today is 0.7. She experiences increased fatigue, likely related to her treatment. Gastrointestinal symptoms have improved after a 'clean out,' but she now has frequent bowel movements, approximately three times daily. She suspects her diet, rich in salads, may contribute to stomach discomfort.  Recent lab results show a decrease in white blood cells from 4.5 to 3.1 and neutrophils from 1.6 to 0.7. She has no fever, chills, cough, or shortness of breath.     Patient Active Problem List   Diagnosis Date Noted   Genetic testing 04/26/2024   Port-A-Cath in place 04/21/2024   Family history of brain cancer    Family history of breast cancer    Family history of ovarian cancer    Breast cancer (HCC)    Malignant neoplasm of upper-outer quadrant of right breast in female, estrogen receptor positive (HCC) 04/03/2024    is allergic to penicillins.  MEDICAL HISTORY: Past Medical History:  Diagnosis Date   Anxiety    Breast cancer (HCC)    hx right breast ca in 02/2024   Family history of brain cancer  Father at 31   Family history of breast cancer    maternal grandmother   Family history of ovarian cancer    Paternal 1st Cousin at 16   HLD (hyperlipidemia)    Hypertension    Seasonal allergies     SURGICAL HISTORY: Past Surgical History:  Procedure Laterality Date   ABDOMINOPLASTY      HERNIA REPAIR     as a child 65 -89yrs old   PORTACATH PLACEMENT N/A 04/12/2024   Procedure: INSERTION, TUNNELED CENTRAL VENOUS DEVICE, WITH PORT;  Surgeon: Vernetta Berg, MD;  Location: [facility] OR;  Service: General;  Laterality: N/A;  PORT PLACEMENT WITH ULTRASOUND GUIDANCE   WISDOM TOOTH EXTRACTION      SOCIAL HISTORY: Social History   Socioeconomic History   Marital status: Legally Separated    Spouse name: Not on file   Number of children: Not on file   Years of education: Not on file   Highest education level: Not on file  Occupational History   Not on file  Tobacco Use   Smoking status: Former    Current packs/day: 0.25    Types: Cigarettes   Smokeless tobacco: Never   Tobacco comments:    Quit smoking 09/2023  Vaping Use   Vaping status: Former  Substance and Sexual Activity   Alcohol use: Yes    Alcohol/week: 1.0 - 2.0 standard drink of alcohol    Types: 1 - 2 Standard drinks or equivalent per week    Comment: wine/liquor   Drug use: Not on file    Comment: CBD Gummies   Sexual activity: Yes    Birth control/protection: Post-menopausal  Other Topics Concern   Not on file  Social History Narrative   Not on file   Social Drivers of Health   Financial Resource Strain: Not on file  Food Insecurity: No Food Insecurity (04/05/2024)   Hunger Vital Sign    Worried About Running Out of Food in the Last Year: Never true    Ran Out of Food in the Last Year: Never true  Transportation Needs: No Transportation Needs (04/05/2024)   PRAPARE - Administrator, Civil Service (Medical): No    Lack of Transportation (Non-Medical): No  Physical Activity: Not on file  Stress: Not on file  Social Connections: Not on file  Intimate Partner Violence: Not At Risk (04/05/2024)   Humiliation, Afraid, Rape, and Kick questionnaire    Fear of Current or Ex-Partner: No    Emotionally Abused: No    Physically Abused: No    Sexually Abused: No    FAMILY HISTORY: Family  History  Problem Relation Age of Onset   Brain cancer Father 64   Breast cancer Maternal Grandmother 93   Cancer Paternal Aunt 91 - 69       Unknown Type   Ovarian cancer Paternal Cousin 83   Cancer Paternal Uncle        Unknown type    Review of Systems  Constitutional:  Positive for fatigue. Negative for appetite change, chills, fever and unexpected weight change.  HENT:   Negative for hearing loss, lump/mass and trouble swallowing.   Eyes:  Negative for eye problems and icterus.  Respiratory:  Negative for chest tightness, cough and shortness of breath.   Cardiovascular:  Negative for chest pain, leg swelling and palpitations.  Gastrointestinal:  Negative for abdominal distention, abdominal pain, constipation, diarrhea, nausea and vomiting.  Endocrine: Negative for hot flashes.  Genitourinary:  Negative for difficulty  urinating.   Musculoskeletal:  Negative for arthralgias.  Skin:  Negative for itching and rash.  Neurological:  Negative for dizziness, extremity weakness, headaches and numbness.  Hematological:  Negative for adenopathy. Does not bruise/bleed easily.  Psychiatric/Behavioral:  Negative for depression. The patient is not nervous/anxious.       PHYSICAL EXAMINATION   Onc Performance Status - 05/04/24 1100       ECOG Perf Status   ECOG Perf Status Restricted in physically strenuous activity but ambulatory and able to carry out work of a light or sedentary nature, e.g., light house work, office work (P)       KPS SCALE   KPS % SCORE Able to carry on normal activity, minor s/s of disease (P)           Vitals:   05/04/24 1130  BP: 127/75  Pulse: 78  Resp: 17  Temp: 97.6 F (36.4 C)  SpO2: 100%    Physical Exam Constitutional:      General: She is not in acute distress.    Appearance: Normal appearance. She is not toxic-appearing.  HENT:     Head: Normocephalic and atraumatic.     Mouth/Throat:     Mouth: Mucous membranes are moist.     Pharynx:  Oropharynx is clear. No oropharyngeal exudate or posterior oropharyngeal erythema.  Eyes:     General: No scleral icterus. Cardiovascular:     Rate and Rhythm: Normal rate and regular rhythm.     Pulses: Normal pulses.     Heart sounds: Normal heart sounds.  Pulmonary:     Effort: Pulmonary effort is normal.     Breath sounds: Normal breath sounds.  Abdominal:     General: Abdomen is flat. Bowel sounds are normal. There is no distension.     Palpations: Abdomen is soft.     Tenderness: There is no abdominal tenderness.  Musculoskeletal:        General: No swelling.     Cervical back: Neck supple.  Lymphadenopathy:     Cervical: No cervical adenopathy.  Skin:    General: Skin is warm and dry.     Findings: No rash.  Neurological:     General: No focal deficit present.     Mental Status: She is alert.  Psychiatric:        Mood and Affect: Mood normal.        Behavior: Behavior normal.     LABORATORY DATA:  CBC    Component Value Date/Time   WBC 3.1 (L) 05/04/2024 1040   WBC 8.8 04/25/2024 0056   RBC 3.68 (L) 05/04/2024 1040   HGB 11.3 (L) 05/04/2024 1040   HCT 32.0 (L) 05/04/2024 1040   PLT 259 05/04/2024 1040   MCV 87.0 05/04/2024 1040   MCH 30.7 05/04/2024 1040   MCHC 35.3 05/04/2024 1040   RDW 13.1 05/04/2024 1040   LYMPHSABS 2.2 05/04/2024 1040   MONOABS 0.1 05/04/2024 1040   EOSABS 0.1 05/04/2024 1040   BASOSABS 0.0 05/04/2024 1040    CMP     Component Value Date/Time   NA 138 05/04/2024 1040   K 3.9 05/04/2024 1040   CL 108 05/04/2024 1040   CO2 27 05/04/2024 1040   GLUCOSE 115 (H) 05/04/2024 1040   BUN 14 05/04/2024 1040   CREATININE 1.09 (H) 05/04/2024 1040   CALCIUM 8.6 (L) 05/04/2024 1040   PROT 6.3 (L) 05/04/2024 1040   ALBUMIN 3.6 05/04/2024 1040   AST 13 (L) 05/04/2024 1040  ALT 20 05/04/2024 1040   ALKPHOS 43 05/04/2024 1040   BILITOT 0.5 05/04/2024 1040   GFRNONAA >60 05/04/2024 1040     ASSESSMENT and THERAPY PLAN:   No  problem-specific Assessment & Plan notes found for this encounter.  Assessment and Plan Assessment & Plan Stage IIB triple negative breast cancer, right upper-outer quadrant Undergoing neoadjuvant chemotherapy with Taxol, Carbo, and Keytruda. Increased fatigue noted, a common chemotherapy side effect. - Delayed chemotherapy cycle due to neutropenia. - Administer Neupogen injections before each Taxol Carbo and three injections before Taxol Carbo Keytruda. - Schedule chemotherapy sessions accordingly.  Chemotherapy-induced neutropenia Neutrophil count decreased from 1.6 to 0.7, increasing infection risk. - Administer Neupogen injections before each chemotherapy session.  Gastrointestinal symptoms related to bowel cleanout Increased bowel movements likely due to bowel cleanout and salad consumption. - Adjust diet to reduce roughage intake.    I reviewed the above with Dr. Gudena who is in agreement with this plan.    All questions were answered. The patient knows to call the clinic with any problems, questions or concerns. We can certainly see the patient much sooner if necessary.  Total encounter time:30 minutes*in face-to-face visit time, chart review, lab review, care coordination, order entry, and documentation of the encounter time.    Morna Kendall, NP 05/04/24 11:55 AM Medical Oncology and Hematology [facility] [address] [place], [place] [address] Tel. [phone]    Fax. [phone]  *Total Encounter Time as defined by the Centers for Medicare and Medicaid Services includes, in addition to the face-to-face time of a patient visit (documented in the note above) non-face-to-face time: obtaining and reviewing outside history, ordering and reviewing medications, tests or procedures, care coordination (communications with other health care professionals or caregivers) and documentation in the medical record.

## 2024-05-05 ENCOUNTER — Inpatient Hospital Stay: Admitting: Licensed Clinical Social Worker

## 2024-05-05 ENCOUNTER — Inpatient Hospital Stay

## 2024-05-06 ENCOUNTER — Inpatient Hospital Stay

## 2024-05-06 ENCOUNTER — Encounter: Payer: Self-pay | Admitting: Hematology and Oncology

## 2024-05-07 ENCOUNTER — Other Ambulatory Visit: Payer: Self-pay

## 2024-05-08 ENCOUNTER — Encounter: Payer: Self-pay | Admitting: Hematology and Oncology

## 2024-05-08 ENCOUNTER — Inpatient Hospital Stay

## 2024-05-08 ENCOUNTER — Other Ambulatory Visit: Payer: Self-pay | Admitting: Pharmacist

## 2024-05-08 NOTE — Assessment & Plan Note (Signed)
 03/30/2024:Screening mammogram detected right upper outer quadrant mass 2.6 cm by mammogram, by ultrasound it measured 2.2 cm at 10 o'clock position, axilla negative.  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 After reviewing the pathology in detail, we proceeded to discuss the different treatment options between surgery, radiation, chemotherapy, antiestrogen therapies.  Treatment plan: Neoadjuvant chemotherapy with Taxol carbo Keytruda followed by Adriamycin Cytoxan Keytruda followed by Sharion maintenance for 1 year Breast conserving surgery with sentinel lymph node biopsy Adjuvant radiation  Patient will also be counseled for Scarlet clinical trial Current treatment:  Keytruda, Taxol, Carbo

## 2024-05-09 ENCOUNTER — Inpatient Hospital Stay

## 2024-05-09 NOTE — Progress Notes (Unsigned)
 HPI:  Ms. Oelkers was previously seen in the [facility] due to a personal and family history of breast cancer and concerns regarding a hereditary predisposition to cancer. Please refer to our prior cancer genetics clinic note for more information regarding our discussion, assessment and recommendations, at the time. Ms. Czaplicki recent genetic test results were disclosed to her, as were recommendations warranted by these results. These results and recommendations are discussed in more detail below.  CANCER HISTORY:  Oncology History  Malignant neoplasm of upper-outer quadrant of right breast in female, estrogen receptor positive (HCC)  03/30/2024 Initial Diagnosis   Screening mammogram detected right upper outer quadrant mass 2.6 cm by mammogram, by ultrasound it measured 2.2 cm at 10 o'clock position, axilla negative.  Left breast: Cysts: Aspirated Biopsy: Grade 3 IDC ER 2%, PR 0%, Ki67 60%, HER2 negative   04/05/2024 Cancer Staging   Staging form: Breast, AJCC 8th Edition - Clinical: Stage IIB (cT2, cN0, cM0, G3, ER-, PR-, HER2-) - Signed by Odean Potts, MD on 04/05/2024 Stage prefix: Initial diagnosis Histologic grading system: 3 grade system   04/15/2024 Genetic Testing   Negative genetic testing. Report date: 04/15/2024  The [facility]RNAinsight Panel includes sequencing, rearrangement analysis, and RNA analysis for the following 40 genes: APC, ATM, BAP1, BARD1, BMPR1A, BRCA1, BRCA2, BRIP1, CDH1, CDKN2A, CHEK2, FH, FLCN, MET, MLH1, MSH2, MSH6, MUTYH, NF1, NTHL1, PALB2, PMS2, PTEN, RAD51C, RAD51D, RPS20, SMAD4, STK11, TP53, TSC1, TSC2 and VHL (sequencing and deletion/duplication); AXIN2, HOXB13, MBD4, MSH3, POLD1 and POLE (sequencing only); EPCAM and GREM1 (deletion/duplication only). RNA data is routinely analyzed for use in variant interpretation for all genes.    04/21/2024 -  Chemotherapy   Patient is on Treatment Plan : BREAST Pembrolizumab (200) D1 +  Carboplatin (1.5) D1,8,15 + Paclitaxel (80) D1,8,15 q21d X 4 cycles / Pembrolizumab (200) D1 + AC D1 q21d x 4 cycles       FAMILY HISTORY:  We obtained a detailed, 4-generation family history.  Significant diagnoses are listed below: Family History  Problem Relation Age of Onset   Brain cancer Father 83   Breast cancer Maternal Grandmother 22   Cancer Paternal Aunt 52 - 69       Unknown Type   Ovarian cancer Paternal Cousin 42   Cancer Paternal Uncle        Unknown type    GENETIC TEST RESULTS: Genetic testing reported out on 04/15/2024 through the *** cancer panel found no pathogenic mutations. {GENE LIST}. The test report has been scanned into EPIC and is located under the Molecular Pathology section of the Results Review tab.  A portion of the result report is included below for reference.   ***  We discussed with Ms. Lemarr that because current genetic testing is not perfect, it is possible there may be a gene mutation in one of these genes that current testing cannot detect, but that chance is small.  We also discussed, that there could be another gene that has not yet been discovered, or that we have not yet tested, that is responsible for the cancer diagnoses in the family. It is also possible there is a hereditary cause for the cancer in the family that Ms. Widener did not inherit and therefore was not identified in her testing.  Therefore, it is important to remain in touch with cancer genetics in the future so that we can continue to offer Ms. Stjulien the most up to date genetic testing.   ***Genetic testing  did identify a variant of uncertain significance (VUS) was identified in the *** gene called ***.  At this time, it is unknown if this variant is associated with increased cancer risk or if this is a normal finding, but most variants such as this get reclassified to being inconsequential. It should not be used to make medical management decisions. With time, we suspect the lab will  determine the significance of this variant, if any. If we do learn more about it, we will try to contact Ms. Trivedi to discuss it further. However, it is important to stay in touch with us  periodically and keep the address and phone number up to date.  ***Genetic testing did identify {number} Variants of uncertain significance (VUS) - one in the *** gene called ***, a second in the *** gene called ***, and a third in the *** gene called ***.  At this time, it is unknown if these variants are associated with increased cancer risk or if they are normal findings, but most variants such as these get reclassified to being inconsequential. They should not be used to make medical management decisions. With time, we suspect the lab will determine the significance of these variants, if any. If we do learn more about them, we will try to contact Ms. Hughson to discuss it further. However, it is important to stay in touch with us  periodically and keep the address and phone number up to date.  ***[For true negative] We recommended Ms. Burr pursue testing for the familial hereditary cancer gene mutation called ***, ***. Ms. Gerety test was normal and did not reveal the familial mutation. We call this result a true negative result because the cancer-causing mutation was identified in Ms. Dohrmann's family, and she did not inherit it.  Given this negative result, Ms. Hathorne's chances of developing ***-related cancers are the same as they are in the general population.    ADDITIONAL GENETIC TESTING: We discussed with Ms. Thayer that there are other genes that are associated with increased cancer risk that can be analyzed. Should Ms. Feijoo wish to pursue additional genetic testing, we are happy to discuss and coordinate this testing, at any time.    ***We discussed with Ms. Degrasse that her genetic testing was fairly extensive.  If there are genes identified to increase cancer risk that can be analyzed in the future, we  would be happy to discuss and coordinate this testing at that time.    CANCER SCREENING RECOMMENDATIONS: Ms. Olsson test result is considered negative (normal).  This means that we have not identified a hereditary cause for her {Personal/family:20331} history of {cancer/polyps} at this time. Most cancers happen by chance and this negative test suggests that her {Personal/family:20331} history of cancer may fall into this category.    Possible reasons for Ms. Crispen's negative genetic test include:  1. There may be a gene mutation in one of these genes that current testing methods cannot detect but that chance is small.  2. There could be another gene that has not yet been discovered, or that we have not yet tested, that is responsible for the cancer diagnoses in the family.  3.  There may be no hereditary risk for cancer in the family. The cancers in Ms. Mossberg and/or her family may be sporadic/familial or due to other genetic and environmental factors. 4. It is also possible there is a hereditary cause for the cancer in the family that Ms. Seifert did not inherit.  Therefore, it is  recommended she continue to follow the cancer management and screening guidelines provided by her ***oncology and primary healthcare provider. An individual's cancer risk and medical management are not determined by genetic test results alone. Overall cancer risk assessment incorporates additional factors, including personal medical history, family history, and any available genetic information that may result in a personalized plan for cancer prevention and surveillance  ***[For affected/unaffected do not believe] Given Ms. Graber's {Personal/family:20331} histories, we must interpret these negative results with some caution.  Families with features suggestive of hereditary risk for cancer tend to have multiple family members with cancer, diagnoses in multiple generations and diagnoses before the age of 53. Ms. Oland  family exhibits some of these features. Thus, this result may simply reflect our current inability to detect all mutations within these genes or there may be a different gene that has not yet been discovered or tested.   An individual's cancer risk and medical management are not determined by genetic test results alone. Overall cancer risk assessment incorporates additional factors, including personal medical history, family history, and any available genetic information that may result in a personalized plan for cancer prevention and surveillance.  ****[BREAST CANCER RISK] Based on Ms. Hudson's {Personal/family:20331} of cancer, as well as her genetic test results, statistical models ({GENMODELS:62370})  and literature data were used to estimate her risk of developing {CA HX:54794}. This estimates her lifetime risk of developing {CA HX:54794} to be approximately ***% to ***%.  The patient's lifetime breast cancer risk is a preliminary estimate based on available information using one of several models endorsed by the [employer] (NCCN).  The NCCN recommends consideration of breast MRI screening as an adjunct to mammography for patients at high risk (defined as 20% or greater lifetime risk). Please note that a woman's breast cancer risk changes over time. It may increase or decrease based on age and any changes to the personal and/or family medical history. The risks and recommendations listed above apply to this patient at this point in time. In the future, she may or may not be eligible for the same medical management strategies and, in some cases, other medical management strategies may become available to her. If she is interested in an updated breast cancer risk assessment at a later date, she can contact us .   ***[HIGH RISK BREAST] Ms. Mccain has been determined to be at high risk for breast cancer. her Tyrer-Cuzick risk score is ***%.  For women with a greater than 20% lifetime  risk of breast cancer, the [employer] (NCCN) recommends the following:   1.      Clinical encounter every 6-12 months to begin when identified as being at increased risk, but not before age 70  2.      Annual mammograms. Tomosynthesis is recommended starting 10 years earlier than the youngest breast cancer diagnosis in the family or at age 44 (whichever comes first), but not before age 70    30.      Annual breast MRI starting 10 years earlier than the youngest breast cancer diagnosis in the family or at age 48 (whichever comes first), but not before age 65.   ***[Breast High Risk FU]  We, therefore, discussed that it is reasonable for Ms. Levett to be followed by a high-risk breast cancer clinic; in addition to a yearly mammogram and physical exam by a healthcare provider, she should discuss the usefulness of an annual breast MRI with the high-risk clinic providers.  ***[GI  High Risk FU] This negative genetic test simply tells us  that we cannot yet define why Ms. Lamoreaux has had *** an increased number of colorectal polyps. *** colorectal cancer at a young age. Ms. Chesney medical management and screening should be based on the prospect that she *** will likely form more colon polyps  *** may be at an increased risk for a second colorectal cancer in the future and should, therefore, undergo more frequent colonoscopy screening at intervals determined by her GI providers.  We also recommended that Ms. Catano have an upper endoscopy periodically.  RECOMMENDATIONS FOR FAMILY MEMBERS:  Individuals in this family might be at some increased risk of developing cancer, over the general population risk, simply due to the family history of cancer.  We recommended women in this family have a yearly mammogram beginning at age 58, or 56 years younger than the earliest onset of cancer, an annual clinical breast exam, and perform monthly breast self-exams. Women in this family should also have a  gynecological exam as recommended by their primary provider. All family members should be referred for colonoscopy starting at age 62, or 8 years younger than the earliest onset of cancer.  *** It is also possible there is a hereditary cause for the cancer in Ms. Trimm's family that she did not inherit and therefore was not identified in her.  Based on Ms. Struble's family history, we recommended her ***, who was diagnosed with *** at age ***, have genetic counseling and testing. Ms. Windish will let us  know if we can be of any assistance in coordinating genetic counseling and/or testing for this family member.   FOLLOW-UP: Lastly, we discussed with Ms. Trefry that cancer genetics is a rapidly advancing field and it is possible that new genetic tests will be appropriate for her and/or her family members in the future. We encouraged her to remain in contact with cancer genetics on an annual basis so we can update her personal and family histories and let her know of advances in cancer genetics that may benefit this family.   Our contact number was provided. Ms. Schmale questions were answered to her satisfaction, and she knows she is welcome to call us  at anytime with additional questions or concerns.   Santana Fryer, MS, CGC  Certified Genetic Counselor  Email: [email or web address]  Phone: [phone]

## 2024-05-10 ENCOUNTER — Encounter: Payer: Self-pay | Admitting: Hematology and Oncology

## 2024-05-10 ENCOUNTER — Inpatient Hospital Stay

## 2024-05-10 VITALS — BP 141/95 | HR 65 | Temp 98.0°F | Resp 18

## 2024-05-10 DIAGNOSIS — Z95828 Presence of other vascular implants and grafts: Secondary | ICD-10-CM

## 2024-05-10 DIAGNOSIS — Z5112 Encounter for antineoplastic immunotherapy: Secondary | ICD-10-CM | POA: Diagnosis not present

## 2024-05-10 MED ORDER — FILGRASTIM-SNDZ 480 MCG/0.8ML IJ SOSY
480.0000 ug | PREFILLED_SYRINGE | Freq: Once | INTRAMUSCULAR | Status: AC
  Administered 2024-05-10: 480 ug via SUBCUTANEOUS

## 2024-05-10 NOTE — Patient Instructions (Signed)
 Filgrastim Injection What is this medication? FILGRASTIM (fil GRA stim) lowers the risk of infection in people who are receiving chemotherapy. It works by Systems analyst make more white blood cells, which protects your body from infection. It may also be used to help people who have been exposed to high doses of radiation. It can be used to help prepare your body before a stem cell transplant. It works by helping your bone marrow make and release stem cells into the blood. This medicine may be used for other purposes; ask your health care provider or pharmacist if you have questions. COMMON BRAND NAME(S): Neupogen, Nivestym, Nypozi, Releuko, Zarxio What should I tell my care team before I take this medication? They need to know if you have any of these conditions: History of blood diseases, such as sickle cell anemia Kidney disease Recent or ongoing radiation An unusual or allergic reaction to filgrastim, pegfilgrastim, latex, rubber, other medications, foods, dyes, or preservatives Pregnant or trying to get pregnant Breast-feeding How should I use this medication? This medication is injected under the skin or into a vein. It is usually given by your care team in a hospital or clinic setting. It may be given at home. If you get this medication at home, you will be taught how to prepare and give it. Use exactly as directed. Take it as directed on the prescription label at the same time every day. Keep taking it unless your care team tells you to stop. It is important that you put your used needles and syringes in a special sharps container. Do not put them in a trash can. If you do not have a sharps container, call your pharmacist or care team to get one. This medication comes with INSTRUCTIONS FOR USE. Ask your pharmacist for directions on how to use this medication. Read the information carefully. Talk to your pharmacist or care team if you have questions. Talk to your care team about the use of  this medication in children. While it may be prescribed for children for selected conditions, precautions do apply. Overdosage: If you think you have taken too much of this medicine contact a poison control center or emergency room at once. NOTE: This medicine is only for you. Do not share this medicine with others. What if I miss a dose? It is important not to miss any doses. Talk to your care team about what to do if you miss a dose. What may interact with this medication? Medications that may cause a release of neutrophils, such as lithium This list may not describe all possible interactions. Give your health care provider a list of all the medicines, herbs, non-prescription drugs, or dietary supplements you use. Also tell them if you smoke, drink alcohol, or use illegal drugs. Some items may interact with your medicine. What should I watch for while using this medication? Your condition will be monitored carefully while you are receiving this medication. You may need bloodwork while taking this medication. Talk to your care team about your risk of cancer. You may be more at risk for certain types of cancer if you take this medication. What side effects may I notice from receiving this medication? Side effects that you should report to your care team as soon as possible: Allergic reactions--skin rash, itching, hives, swelling of the face, lips, tongue, or throat Capillary leak syndrome--stomach or muscle pain, unusual weakness or fatigue, feeling faint or lightheaded, decrease in the amount of urine, swelling of the ankles, hands,  or feet, trouble breathing High white blood cell level--fever, fatigue, trouble breathing, night sweats, change in vision, weight loss Inflammation of the aorta--fever, fatigue, back, chest, or stomach pain, severe headache Kidney injury (glomerulonephritis)--decrease in the amount of urine, red or dark brown urine, foamy or bubbly urine, swelling of the ankles, hands,  or feet Shortness of breath or trouble breathing Spleen injury--pain in upper left stomach or shoulder Unusual bruising or bleeding Side effects that usually do not require medical attention (report to your care team if they continue or are bothersome): Back pain Bone pain Fatigue Fever Headache Nausea This list may not describe all possible side effects. Call your doctor for medical advice about side effects. You may report side effects to FDA at 1-800-FDA-1088. Where should I keep my medication? Keep out of the reach of children and pets. Keep this medication in the original packaging until you are ready to take it. Protect from light. See product for storage information. Each product may have different instructions. Get rid of any unused medication after the expiration date. To get rid of medications that are no longer needed or have expired: Take the medication to a medications take-back program. Check with your pharmacy or law enforcement to find a location. If you cannot return the medication, ask your pharmacist or care team how to get rid of this medication safely. NOTE: This sheet is a summary. It may not cover all possible information. If you have questions about this medicine, talk to your doctor, pharmacist, or health care provider.  2024 Elsevier/Gold Standard (2021-11-06 00:00:00)

## 2024-05-11 ENCOUNTER — Other Ambulatory Visit: Payer: Self-pay | Admitting: Hematology and Oncology

## 2024-05-11 ENCOUNTER — Encounter: Payer: Self-pay | Admitting: Hematology and Oncology

## 2024-05-11 DIAGNOSIS — Z170 Estrogen receptor positive status [ER+]: Secondary | ICD-10-CM

## 2024-05-12 ENCOUNTER — Encounter: Payer: Self-pay | Admitting: *Deleted

## 2024-05-12 ENCOUNTER — Inpatient Hospital Stay

## 2024-05-12 ENCOUNTER — Inpatient Hospital Stay (HOSPITAL_BASED_OUTPATIENT_CLINIC_OR_DEPARTMENT_OTHER): Admitting: Hematology and Oncology

## 2024-05-12 ENCOUNTER — Inpatient Hospital Stay: Admitting: Licensed Clinical Social Worker

## 2024-05-12 VITALS — BP 148/84 | HR 68 | Temp 97.8°F | Resp 18 | Ht 64.0 in | Wt 218.9 lb

## 2024-05-12 DIAGNOSIS — K5903 Drug induced constipation: Secondary | ICD-10-CM | POA: Diagnosis not present

## 2024-05-12 DIAGNOSIS — Z170 Estrogen receptor positive status [ER+]: Secondary | ICD-10-CM

## 2024-05-12 DIAGNOSIS — C50411 Malignant neoplasm of upper-outer quadrant of right female breast: Secondary | ICD-10-CM | POA: Diagnosis not present

## 2024-05-12 DIAGNOSIS — Z5112 Encounter for antineoplastic immunotherapy: Secondary | ICD-10-CM | POA: Diagnosis not present

## 2024-05-12 LAB — CMP (CANCER CENTER ONLY)
ALT: 38 U/L (ref 0–44)
AST: 23 U/L (ref 15–41)
Albumin: 3.9 g/dL (ref 3.5–5.0)
Alkaline Phosphatase: 56 U/L (ref 38–126)
Anion gap: 7 (ref 5–15)
BUN: 12 mg/dL (ref 6–20)
CO2: 28 mmol/L (ref 22–32)
Calcium: 9.1 mg/dL (ref 8.9–10.3)
Chloride: 104 mmol/L (ref 98–111)
Creatinine: 1.13 mg/dL — ABNORMAL HIGH (ref 0.44–1.00)
GFR, Estimated: 59 mL/min — ABNORMAL LOW (ref >60)
Glucose, Bld: 98 mg/dL (ref 70–99)
Potassium: 3.5 mmol/L (ref 3.5–5.1)
Sodium: 139 mmol/L (ref 135–145)
Total Bilirubin: 0.5 mg/dL (ref 0.0–1.2)
Total Protein: 6.9 g/dL (ref 6.5–8.1)

## 2024-05-12 LAB — CBC WITH DIFFERENTIAL (CANCER CENTER ONLY)
Abs Immature Granulocytes: 0.31 K/uL — ABNORMAL HIGH (ref 0.00–0.07)
Basophils Absolute: 0.1 K/uL (ref 0.0–0.1)
Basophils Relative: 0 %
Eosinophils Absolute: 0.1 K/uL (ref 0.0–0.5)
Eosinophils Relative: 1 %
HCT: 32.5 % — ABNORMAL LOW (ref 36.0–46.0)
Hemoglobin: 11.5 g/dL — ABNORMAL LOW (ref 12.0–15.0)
Immature Granulocytes: 3 %
Lymphocytes Relative: 23 %
Lymphs Abs: 2.9 K/uL (ref 0.7–4.0)
MCH: 30.7 pg (ref 26.0–34.0)
MCHC: 35.4 g/dL (ref 30.0–36.0)
MCV: 86.7 fL (ref 80.0–100.0)
Monocytes Absolute: 1.4 K/uL — ABNORMAL HIGH (ref 0.1–1.0)
Monocytes Relative: 11 %
Neutro Abs: 7.8 K/uL — ABNORMAL HIGH (ref 1.7–7.7)
Neutrophils Relative %: 62 %
Platelet Count: 246 K/uL (ref 150–400)
RBC: 3.75 MIL/uL — ABNORMAL LOW (ref 3.87–5.11)
RDW: 13.8 % (ref 11.5–15.5)
WBC Count: 12.4 K/uL — ABNORMAL HIGH (ref 4.0–10.5)
nRBC: 0.5 % — ABNORMAL HIGH (ref 0.0–0.2)

## 2024-05-12 MED ORDER — SODIUM CHLORIDE 0.9 % IV SOLN
80.0000 mg/m2 | Freq: Once | INTRAVENOUS | Status: AC
  Administered 2024-05-12: 168 mg via INTRAVENOUS
  Filled 2024-05-12: qty 28

## 2024-05-12 MED ORDER — PALONOSETRON HCL INJECTION 0.25 MG/5ML
0.2500 mg | Freq: Once | INTRAVENOUS | Status: AC
  Administered 2024-05-12: 0.25 mg via INTRAVENOUS
  Filled 2024-05-12: qty 5

## 2024-05-12 MED ORDER — SODIUM CHLORIDE 0.9 % IV SOLN: INTRAVENOUS | Status: DC

## 2024-05-12 MED ORDER — DIPHENHYDRAMINE HCL 50 MG/ML IJ SOLN
50.0000 mg | Freq: Once | INTRAMUSCULAR | Status: AC
  Administered 2024-05-12: 50 mg via INTRAVENOUS
  Filled 2024-05-12: qty 1

## 2024-05-12 MED ORDER — FAMOTIDINE IN NACL 20-0.9 MG/50ML-% IV SOLN
20.0000 mg | Freq: Once | INTRAVENOUS | Status: AC
  Administered 2024-05-12: 20 mg via INTRAVENOUS
  Filled 2024-05-12: qty 50

## 2024-05-12 MED ORDER — DEXAMETHASONE SOD PHOSPHATE PF 10 MG/ML IJ SOLN
10.0000 mg | Freq: Once | INTRAMUSCULAR | Status: AC
  Administered 2024-05-12: 10 mg via INTRAVENOUS

## 2024-05-12 MED ORDER — SODIUM CHLORIDE 0.9 % IV SOLN
175.0500 mg | Freq: Once | INTRAVENOUS | Status: AC
  Administered 2024-05-12: 180 mg via INTRAVENOUS
  Filled 2024-05-12: qty 18

## 2024-05-12 NOTE — Patient Instructions (Signed)
 [facility] - A DEPT OF [facility]  Discharge Instructions: Thank you for choosing [facility] to provide your oncology and hematology care.   If you have a lab appointment with the Cancer Center, please go directly to the Cancer Center and check in at the registration area.   Wear comfortable clothing and clothing appropriate for easy access to any Portacath or PICC line.   We strive to give you quality time with your provider. You may need to reschedule your appointment if you arrive late (15 or more minutes).  Arriving late affects you and other patients whose appointments are after yours.  Also, if you miss three or more appointments without notifying the office, you may be dismissed from the clinic at the provider's discretion.      For prescription refill requests, have your pharmacy contact our office and allow 72 hours for refills to be completed.    Today you received the following chemotherapy and/or immunotherapy agents: Paclitaxel , Carboplatin .       To help prevent nausea and vomiting after your treatment, we encourage you to take your nausea medication as directed.  BELOW ARE SYMPTOMS THAT SHOULD BE REPORTED IMMEDIATELY: *FEVER GREATER THAN 100.4 F (38 C) OR HIGHER *CHILLS OR SWEATING *NAUSEA AND VOMITING THAT IS NOT CONTROLLED WITH YOUR NAUSEA MEDICATION *UNUSUAL SHORTNESS OF BREATH *UNUSUAL BRUISING OR BLEEDING *URINARY PROBLEMS (pain or burning when urinating, or frequent urination) *BOWEL PROBLEMS (unusual diarrhea, constipation, pain near the anus) TENDERNESS IN MOUTH AND THROAT WITH OR WITHOUT PRESENCE OF ULCERS (sore throat, sores in mouth, or a toothache) UNUSUAL RASH, SWELLING OR PAIN  UNUSUAL VAGINAL DISCHARGE OR ITCHING   Items with * indicate a potential emergency and should be followed up as soon as possible or go to the Emergency Department if any problems should occur.  Please show the CHEMOTHERAPY ALERT CARD  or IMMUNOTHERAPY ALERT CARD at check-in to the Emergency Department and triage nurse.  Should you have questions after your visit or need to cancel or reschedule your appointment, please contact [facility] - A DEPT OF [facility]  Dept: [phone]  and follow the prompts.  Office hours are 8:00 a.m. to 4:30 p.m. Monday - Friday. Please note that voicemails left after 4:00 p.m. may not be returned until the following business day.  We are closed weekends and major holidays. You have access to a nurse at all times for urgent questions. Please call the main number to the clinic Dept: [phone] and follow the prompts.   For any non-urgent questions, you may also contact your provider using MyChart. We now offer e-Visits for anyone 64 and older to request care online for non-urgent symptoms. For details visit [email or web address].   Also download the MyChart app! Go to the app store, search MyChart, open the app, select [employer], and log in with your MyChart username and password.

## 2024-05-12 NOTE — Progress Notes (Signed)
 [facility] CSW Progress Note  Visual Merchandiser met with patient to follow-up on need for community resources.    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

## 2024-05-12 NOTE — Progress Notes (Signed)
 [facility] Cancer Follow up:    Angela Nell SAILOR, FNP [address] [name] [address]   DIAGNOSIS:  Cancer Staging  Malignant neoplasm of upper-outer quadrant of right breast in female, estrogen receptor positive (HCC) Staging form: Breast, AJCC 8th Edition - Clinical: Stage IIB (cT2, cN0, cM0, G3, ER-, PR-, HER2-) - Signed by Odean Potts, MD on 04/05/2024 Stage prefix: Initial diagnosis Histologic grading system: 3 grade system    SUMMARY OF ONCOLOGIC HISTORY: Oncology History  Malignant neoplasm of upper-outer quadrant of right breast in female, estrogen receptor positive (HCC)  03/30/2024 Initial Diagnosis   Screening mammogram detected right upper outer quadrant mass 2.6 cm by mammogram, by ultrasound it measured 2.2 cm at 10 o'clock position, axilla negative.  Left breast: Cysts: Aspirated Biopsy: Grade 3 IDC ER 2%, PR 0%, Ki67 60%, HER2 negative   04/05/2024 Cancer Staging   Staging form: Breast, AJCC 8th Edition - Clinical: Stage IIB (cT2, cN0, cM0, G3, ER-, PR-, HER2-) - Signed by Odean Potts, MD on 04/05/2024 Stage prefix: Initial diagnosis Histologic grading system: 3 grade system   04/15/2024 Genetic Testing   Negative genetic testing. Report date: 04/15/2024  The [facility] CancerNext+RNAinsight Panel includes sequencing, rearrangement analysis, and RNA analysis for the following 40 genes: APC, ATM, BAP1, BARD1, BMPR1A, BRCA1, BRCA2, BRIP1, CDH1, CDKN2A, CHEK2, FH, FLCN, MET, MLH1, MSH2, MSH6, MUTYH, NF1, NTHL1, PALB2, PMS2, PTEN, RAD51C, RAD51D, RPS20, SMAD4, STK11, TP53, TSC1, TSC2 and VHL (sequencing and deletion/duplication); AXIN2, HOXB13, MBD4, MSH3, POLD1 and POLE (sequencing only); EPCAM and GREM1 (deletion/duplication only). RNA data is routinely analyzed for use in variant interpretation for all genes.    04/21/2024 -  Chemotherapy   Patient is on Treatment Plan : BREAST Pembrolizumab (200) D1 + Carboplatin (1.5) D1,8,15 +  Paclitaxel (80) D1,8,15 q21d X 4 cycles / Pembrolizumab (200) D1 + AC D1 q21d x 4 cycles      Genetic Testing   Negative genetic testing. Report date: 04/15/2024  The [facility] CancerNext+RNAinsight Panel includes sequencing, rearrangement analysis, and RNA analysis for the following 40 genes: APC, ATM, BAP1, BARD1, BMPR1A, BRCA1, BRCA2, BRIP1, CDH1, CDKN2A, CHEK2, FH, FLCN, MET, MLH1, MSH2, MSH6, MUTYH, NF1, NTHL1, PALB2, PMS2, PTEN, RAD51C, RAD51D, RPS20, SMAD4, STK11, TP53, TSC1, TSC2 and VHL (sequencing and deletion/duplication); AXIN2, HOXB13, MBD4, MSH3, POLD1 and POLE (sequencing only); EPCAM and GREM1 (deletion/duplication only). RNA data is routinely analyzed for use in variant interpretation for all genes.      CURRENT THERAPY: taxol/carbo/keytruda  INTERVAL HISTORY:  Discussed the use of AI scribe software for clinical note transcription with the patient, who gave verbal consent to proceed.  History of Present Illness  Angela Marshall is a 48 year old female with triple negative breast cancer of Dr Odean who presents for follow-up during neoadjuvant chemotherapy.  She is undergoing neoadjuvant chemotherapy for triple negative breast cancer. During the first week of treatment, she experienced constipation, mild and tolerable. She can no longer feel the breast mass and she is very happy about this.  No nausea, vomiting, fevers, chills, or tingling and numbness in her hands or feet. Her blood work has improved since the last visit when her blood count was low. She received an injection to address this, which caused significant bone pain 8 to 12 hours post-administration. She managed the bone pain with Zyrtec.  Her treatment schedule was adjusted due to low blood counts, resulting in a break from chemotherapy last week and receiving an injection  instead. She is currently on a schedule that includes injections on Wednesdays, port access on Thursdays, and treatment on  Fridays.     Patient Active Problem List   Diagnosis Date Noted   Genetic testing 04/26/2024   Port-A-Cath in place 04/21/2024   Family history of brain cancer    Family history of breast cancer    Family history of ovarian cancer    Breast cancer (HCC)    Malignant neoplasm of upper-outer quadrant of right breast in female, estrogen receptor positive (HCC) 04/03/2024    is allergic to penicillins.  MEDICAL HISTORY: Past Medical History:  Diagnosis Date   Anxiety    Breast cancer (HCC)    hx right breast ca in 02/2024   Family history of brain cancer    Father at 73   Family history of breast cancer    maternal grandmother   Family history of ovarian cancer    Paternal 1st Cousin at 22   HLD (hyperlipidemia)    Hypertension    Seasonal allergies     SURGICAL HISTORY: Past Surgical History:  Procedure Laterality Date   ABDOMINOPLASTY     HERNIA REPAIR     as a child 36 -39yrs old   PORTACATH PLACEMENT N/A 04/12/2024   Procedure: INSERTION, TUNNELED CENTRAL VENOUS DEVICE, WITH PORT;  Surgeon: Vernetta Berg, MD;  Location: [facility] OR;  Service: General;  Laterality: N/A;  PORT PLACEMENT WITH ULTRASOUND GUIDANCE   WISDOM TOOTH EXTRACTION      SOCIAL HISTORY: Social History   Socioeconomic History   Marital status: Legally Separated    Spouse name: Not on file   Number of children: Not on file   Years of education: Not on file   Highest education level: Not on file  Occupational History   Not on file  Tobacco Use   Smoking status: Former    Current packs/day: 0.25    Types: Cigarettes   Smokeless tobacco: Never   Tobacco comments:    Quit smoking 09/2023  Vaping Use   Vaping status: Former  Substance and Sexual Activity   Alcohol use: Yes    Alcohol/week: 1.0 - 2.0 standard drink of alcohol    Types: 1 - 2 Standard drinks or equivalent per week    Comment: wine/liquor   Drug use: Not on file    Comment: CBD Gummies   Sexual activity: Yes    Birth  control/protection: Post-menopausal  Other Topics Concern   Not on file  Social History Narrative   Not on file   Social Drivers of Health   Financial Resource Strain: Not on file  Food Insecurity: No Food Insecurity (04/05/2024)   Hunger Vital Sign    Worried About Running Out of Food in the Last Year: Never true    Ran Out of Food in the Last Year: Never true  Transportation Needs: No Transportation Needs (04/05/2024)   PRAPARE - Administrator, Civil Service (Medical): No    Lack of Transportation (Non-Medical): No  Physical Activity: Not on file  Stress: Not on file  Social Connections: Not on file  Intimate Partner Violence: Not At Risk (04/05/2024)   Humiliation, Afraid, Rape, and Kick questionnaire    Fear of Current or Ex-Partner: No    Emotionally Abused: No    Physically Abused: No    Sexually Abused: No    FAMILY HISTORY: Family History  Problem Relation Age of Onset   Brain cancer Father 47   Breast  cancer Maternal Grandmother 23   Cancer Paternal Aunt 67 - 69       Unknown Type   Ovarian cancer Paternal Cousin 110   Cancer Paternal Uncle        Unknown type    Review of Systems  Constitutional:  Positive for fatigue. Negative for appetite change, chills, fever and unexpected weight change.  HENT:   Negative for hearing loss, lump/mass and trouble swallowing.   Eyes:  Negative for eye problems and icterus.  Respiratory:  Negative for chest tightness, cough and shortness of breath.   Cardiovascular:  Negative for chest pain, leg swelling and palpitations.  Gastrointestinal:  Negative for abdominal distention, abdominal pain, constipation, diarrhea, nausea and vomiting.  Endocrine: Negative for hot flashes.  Genitourinary:  Negative for difficulty urinating.   Musculoskeletal:  Negative for arthralgias.  Skin:  Negative for itching and rash.  Neurological:  Negative for dizziness, extremity weakness, headaches and numbness.  Hematological:   Negative for adenopathy. Does not bruise/bleed easily.  Psychiatric/Behavioral:  Negative for depression. The patient is not nervous/anxious.       PHYSICAL EXAMINATION   Onc Performance Status - 05/12/24 1200       KPS SCALE   KPS % SCORE Able to carry on normal activity, minor s/s of disease           Vitals:   05/12/24 1236  BP: (!) 148/84  Pulse: 68  Resp: 18  Temp: 97.8 F (36.6 C)  SpO2: 100%     Physical Exam Constitutional:      General: She is not in acute distress.    Appearance: Normal appearance. She is not toxic-appearing.  HENT:     Head: Normocephalic and atraumatic.     Mouth/Throat:     Mouth: Mucous membranes are moist.     Pharynx: Oropharynx is clear. No oropharyngeal exudate or posterior oropharyngeal erythema.  Eyes:     General: No scleral icterus. Cardiovascular:     Rate and Rhythm: Normal rate and regular rhythm.     Pulses: Normal pulses.     Heart sounds: Normal heart sounds.  Pulmonary:     Effort: Pulmonary effort is normal.     Breath sounds: Normal breath sounds.  Chest:     Comments: No palpable breast mass or regional adenopathy Abdominal:     General: Abdomen is flat. Bowel sounds are normal. There is no distension.     Palpations: Abdomen is soft.     Tenderness: There is no abdominal tenderness.  Musculoskeletal:        General: No swelling.     Cervical back: Neck supple.  Lymphadenopathy:     Cervical: No cervical adenopathy.  Skin:    General: Skin is warm and dry.     Findings: No rash.  Neurological:     General: No focal deficit present.     Mental Status: She is alert.  Psychiatric:        Mood and Affect: Mood normal.        Behavior: Behavior normal.     LABORATORY DATA:  CBC    Component Value Date/Time   WBC 12.4 (H) 05/12/2024 1200   WBC 8.8 04/25/2024 0056   RBC 3.75 (L) 05/12/2024 1200   HGB 11.5 (L) 05/12/2024 1200   HCT 32.5 (L) 05/12/2024 1200   PLT 246 05/12/2024 1200   MCV 86.7  05/12/2024 1200   MCH 30.7 05/12/2024 1200   MCHC 35.4 05/12/2024 1200  RDW 13.8 05/12/2024 1200   LYMPHSABS 2.9 05/12/2024 1200   MONOABS 1.4 (H) 05/12/2024 1200   EOSABS 0.1 05/12/2024 1200   BASOSABS 0.1 05/12/2024 1200    CMP     Component Value Date/Time   NA 139 05/12/2024 1200   K 3.5 05/12/2024 1200   CL 104 05/12/2024 1200   CO2 28 05/12/2024 1200   GLUCOSE 98 05/12/2024 1200   BUN 12 05/12/2024 1200   CREATININE 1.13 (H) 05/12/2024 1200   CALCIUM 9.1 05/12/2024 1200   PROT 6.9 05/12/2024 1200   ALBUMIN 3.9 05/12/2024 1200   AST 23 05/12/2024 1200   ALT 38 05/12/2024 1200   ALKPHOS 56 05/12/2024 1200   BILITOT 0.5 05/12/2024 1200   GFRNONAA 59 (L) 05/12/2024 1200     ASSESSMENT and THERAPY PLAN:   No problem-specific Assessment & Plan notes found for this encounter.  Assessment and Plan Assessment & Plan Stage IIB triple negative breast cancer, right upper-outer quadrant Undergoing neoadjuvant chemotherapy with Taxol, Carbo, and Keytruda.  breast mass no longer palpable. Blood counts improved post-GCSF - Continue neoadjuvant chemotherapy regimen. - Schedule follow-up with Dr. Gudena  Chemotherapy-induced constipation Constipation improved with dietary adjustments   Chemotherapy-induced heartburn Heartburn managed with pantoprazole, improving symptoms. - Continue pantoprazole.  Bone pain after chemotherapy-related injection Bone pain post-injection managed with Zyrtec. - Continue Zyrtec.    All questions were answered. The patient knows to call the clinic with any problems, questions or concerns. We can certainly see the patient much sooner if necessary.  Total encounter time:30 minutes*in face-to-face visit time, chart review, lab review, care coordination, order entry, and documentation of the encounter time.  *Total Encounter Time as defined by the Centers for Medicare and Medicaid Services includes, in addition to the face-to-face time of a  patient visit (documented in the note above) non-face-to-face time: obtaining and reviewing outside history, ordering and reviewing medications, tests or procedures, care coordination (communications with other health care professionals or caregivers) and documentation in the medical record.

## 2024-05-12 NOTE — Research (Signed)
 [id number], ICE COMPRESS: RANDOMIZED TRIAL OF LIMB CRYOCOMPRESSION VERSUS CONTINUOUS COMPRESSION VERSUS LOW CYCLIC COMPRESSION FOR THE PREVENTION OF TAXANE-INDUCED PERIPHERAL NEUROPATHY  Arm 2 Continuous Compression-Treatment 3 Patient arrives today unaccompanied for treatment 3. This treatment was delayed by one week due to low ANC last week.  Confirmed patient does not have wounds, sores, or lesions to extremities. Patient has not had any vaccinations since last visit.  Patient states she is willing to continue the Paxman device with today's treatment.     ADVERSE EVENTS: Solicited AEs reviewed with patient. See AE table below for solicited AEs.  She denies any neuropathy symptoms.   Treatment 2 AEs 04/28/24-05/12/24 Adverse Event CTCAE Grade Onset date Resolved date Relationship to Study Intervention Action Taken Comments  Skin atrophy (solicited) 0            Skin hyperpigmentation (solicited) 0            Skin hypopigmentation (solicited) 0            Skin induration (solicited) 0            Skin ulceration (solicited) 0            Rash maculopapular (solicited) 0            Nail changes (solicited) 0            Cold intolerance (solicited) (general disorders and administration site conditions- other) 0            Frostbite (solicited) (skin and subcutaneous tissue disorders- other) 0                STUDY INTERVENTION & TOLERABILITY ASSESSMENTS: 1:28 pm- Device wraps applied; pre-treatment started set on Arm 2 Continuous Compression, pressure setting 25 mmHG. 1:35 pm- Required 5-15 minute Tolerability check- pt states device is tolerable to compression and wraps.  2:01 pm- Taxane infusion start time. Device display moved to treatment phase. 3:04 pm Taxane infusion stop time. Device display moved to post treatment phase.   3:06 pm Device paused for bathroom break x 5 minutes and reapplied at 3:11 pm.  3:45 pm  Device stopped and wraps removed. Confirmed skin is intact and no wounds or  sores.   PLAN:  The patient was thanked for their time and participation in this study. 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 Laury, research oordinator, will cover this visit and device management at next scheduled infusion 05/19/24. She verbalized understanding. Patient has been provided direct contact information and is encouraged to contact this Nurse for any needs or questions.   Cherylyn Hoard, BSN, RN, [employer] Research Nurse II [phone] 05/12/2024

## 2024-05-13 ENCOUNTER — Inpatient Hospital Stay

## 2024-05-13 VITALS — BP 146/92 | HR 72 | Temp 97.3°F | Resp 20

## 2024-05-13 DIAGNOSIS — Z170 Estrogen receptor positive status [ER+]: Secondary | ICD-10-CM

## 2024-05-13 DIAGNOSIS — Z5112 Encounter for antineoplastic immunotherapy: Secondary | ICD-10-CM | POA: Diagnosis not present

## 2024-05-13 MED ORDER — FILGRASTIM-SNDZ 480 MCG/0.8ML IJ SOSY
480.0000 ug | PREFILLED_SYRINGE | Freq: Once | INTRAMUSCULAR | Status: AC
  Administered 2024-05-13: 480 ug via SUBCUTANEOUS
  Filled 2024-05-13: qty 0.8

## 2024-05-14 ENCOUNTER — Other Ambulatory Visit: Payer: Self-pay

## 2024-05-15 ENCOUNTER — Inpatient Hospital Stay

## 2024-05-15 VITALS — BP 138/89 | HR 61 | Resp 18

## 2024-05-15 DIAGNOSIS — C50411 Malignant neoplasm of upper-outer quadrant of right female breast: Secondary | ICD-10-CM

## 2024-05-15 DIAGNOSIS — Z5112 Encounter for antineoplastic immunotherapy: Secondary | ICD-10-CM | POA: Diagnosis not present

## 2024-05-15 MED ORDER — FILGRASTIM-SNDZ 480 MCG/0.8ML IJ SOSY
480.0000 ug | PREFILLED_SYRINGE | Freq: Once | INTRAMUSCULAR | Status: AC
  Administered 2024-05-15: 480 ug via SUBCUTANEOUS
  Filled 2024-05-15: qty 0.8

## 2024-05-16 ENCOUNTER — Inpatient Hospital Stay

## 2024-05-16 VITALS — BP 125/90 | HR 80 | Temp 98.5°F | Resp 18

## 2024-05-16 DIAGNOSIS — Z5112 Encounter for antineoplastic immunotherapy: Secondary | ICD-10-CM | POA: Diagnosis not present

## 2024-05-16 DIAGNOSIS — Z170 Estrogen receptor positive status [ER+]: Secondary | ICD-10-CM

## 2024-05-16 MED ORDER — FILGRASTIM-SNDZ 480 MCG/0.8ML IJ SOSY
480.0000 ug | PREFILLED_SYRINGE | Freq: Once | INTRAMUSCULAR | Status: AC
  Administered 2024-05-16: 480 ug via SUBCUTANEOUS
  Filled 2024-05-16: qty 0.8

## 2024-05-17 ENCOUNTER — Inpatient Hospital Stay

## 2024-05-17 ENCOUNTER — Other Ambulatory Visit: Payer: Self-pay | Admitting: *Deleted

## 2024-05-17 DIAGNOSIS — C50411 Malignant neoplasm of upper-outer quadrant of right female breast: Secondary | ICD-10-CM

## 2024-05-18 ENCOUNTER — Other Ambulatory Visit: Payer: Self-pay | Admitting: Pharmacist

## 2024-05-18 ENCOUNTER — Other Ambulatory Visit: Payer: Self-pay

## 2024-05-18 ENCOUNTER — Inpatient Hospital Stay

## 2024-05-18 ENCOUNTER — Inpatient Hospital Stay: Admitting: Hematology and Oncology

## 2024-05-18 VITALS — BP 136/87 | HR 81 | Temp 97.9°F | Resp 17 | Wt 217.3 lb

## 2024-05-18 DIAGNOSIS — Z170 Estrogen receptor positive status [ER+]: Secondary | ICD-10-CM | POA: Diagnosis not present

## 2024-05-18 DIAGNOSIS — C50411 Malignant neoplasm of upper-outer quadrant of right female breast: Secondary | ICD-10-CM

## 2024-05-18 DIAGNOSIS — Z5112 Encounter for antineoplastic immunotherapy: Secondary | ICD-10-CM | POA: Diagnosis not present

## 2024-05-18 LAB — CMP (CANCER CENTER ONLY)
ALT: 29 U/L (ref 0–44)
AST: 19 U/L (ref 15–41)
Albumin: 4.1 g/dL (ref 3.5–5.0)
Alkaline Phosphatase: 100 U/L (ref 38–126)
Anion gap: 11 (ref 5–15)
BUN: 17 mg/dL (ref 6–20)
CO2: 25 mmol/L (ref 22–32)
Calcium: 9.2 mg/dL (ref 8.9–10.3)
Chloride: 103 mmol/L (ref 98–111)
Creatinine: 1.06 mg/dL — ABNORMAL HIGH (ref 0.44–1.00)
GFR, Estimated: >60 mL/min (ref >60)
Glucose, Bld: 88 mg/dL (ref 70–99)
Potassium: 3.6 mmol/L (ref 3.5–5.1)
Sodium: 139 mmol/L (ref 135–145)
Total Bilirubin: 0.4 mg/dL (ref 0.0–1.2)
Total Protein: 6.9 g/dL (ref 6.5–8.1)

## 2024-05-18 LAB — CBC WITH DIFFERENTIAL (CANCER CENTER ONLY)
Abs Immature Granulocytes: 0.1 K/uL — ABNORMAL HIGH (ref 0.00–0.07)
Basophils Absolute: 0 K/uL (ref 0.0–0.1)
Basophils Relative: 0 %
Eosinophils Absolute: 0.1 K/uL (ref 0.0–0.5)
Eosinophils Relative: 1 %
HCT: 33.2 % — ABNORMAL LOW (ref 36.0–46.0)
Hemoglobin: 11.5 g/dL — ABNORMAL LOW (ref 12.0–15.0)
Immature Granulocytes: 1 %
Lymphocytes Relative: 34 %
Lymphs Abs: 4.1 K/uL — ABNORMAL HIGH (ref 0.7–4.0)
MCH: 29.9 pg (ref 26.0–34.0)
MCHC: 34.6 g/dL (ref 30.0–36.0)
MCV: 86.5 fL (ref 80.0–100.0)
Monocytes Absolute: 0.5 K/uL (ref 0.1–1.0)
Monocytes Relative: 4 %
Neutro Abs: 7.3 K/uL (ref 1.7–7.7)
Neutrophils Relative %: 60 %
Platelet Count: 178 K/uL (ref 150–400)
RBC: 3.84 MIL/uL — ABNORMAL LOW (ref 3.87–5.11)
RDW: 13.4 % (ref 11.5–15.5)
WBC Count: 12.1 K/uL — ABNORMAL HIGH (ref 4.0–10.5)
nRBC: 0.2 % (ref 0.0–0.2)

## 2024-05-18 LAB — TSH: TSH: 1.02 u[IU]/mL (ref 0.350–4.500)

## 2024-05-18 NOTE — Assessment & Plan Note (Signed)
 03/30/2024:Screening mammogram detected right upper outer quadrant mass 2.6 cm by mammogram, by ultrasound it measured 2.2 cm at 10 o'clock position, axilla negative.  Left breast: Cysts: Aspirated Biopsy: Grade 3 IDC ER 2%, PR 0%, Ki67 60%, HER2 negative   Treatment plan: Neoadjuvant chemotherapy with Taxol carbo Keytruda followed by Adriamycin Cytoxan Keytruda followed by Sharion maintenance for 1 year started 04/21/2024 Breast conserving surgery with sentinel lymph node biopsy Adjuvant radiation ------------------------------------------------------------------------------------------------------------------------------------ Current treatment: Cycle 4 Taxol carbo Keytruda Chemo toxicities: Heartburn: Managed with pantoprazole Bone pain  Return to clinic weekly for chemotherapy

## 2024-05-18 NOTE — Progress Notes (Addendum)
 Patient Care Team: Lenon Nell SAILOR, FNP as PCP - General (Nurse Practitioner) Gerome, Devere HERO, RN as Oncology Nurse Navigator Tyree Nanetta SAILOR, RN as Oncology Nurse Navigator Vernetta Berg, MD as Consulting Physician (General Surgery) Odean Potts, MD as Consulting Physician (Hematology and Oncology) Dewey Rush, MD as Consulting Physician (Radiation Oncology)  DIAGNOSIS:  Encounter Diagnosis  Name Primary?   Malignant neoplasm of upper-outer quadrant of right breast in female, estrogen receptor positive (HCC) Yes    SUMMARY OF ONCOLOGIC HISTORY: Oncology History  Malignant neoplasm of upper-outer quadrant of right breast in female, estrogen receptor positive (HCC)  03/30/2024 Initial Diagnosis   Screening mammogram detected right upper outer quadrant mass 2.6 cm by mammogram, by ultrasound it measured 2.2 cm at 10 o'clock position, axilla negative.  Left breast: Cysts: Aspirated Biopsy: Grade 3 IDC ER 2%, PR 0%, Ki67 60%, HER2 negative   04/05/2024 Cancer Staging   Staging form: Breast, AJCC 8th Edition - Clinical: Stage IIB (cT2, cN0, cM0, G3, ER-, PR-, HER2-) - Signed by Odean Potts, MD on 04/05/2024 Stage prefix: Initial diagnosis Histologic grading system: 3 grade system   04/15/2024 Genetic Testing   Negative genetic testing. Report date: 04/15/2024  The [facility] CancerNext+RNAinsight Panel includes sequencing, rearrangement analysis, and RNA analysis for the following 40 genes: APC, ATM, BAP1, BARD1, BMPR1A, BRCA1, BRCA2, BRIP1, CDH1, CDKN2A, CHEK2, FH, FLCN, MET, MLH1, MSH2, MSH6, MUTYH, NF1, NTHL1, PALB2, PMS2, PTEN, RAD51C, RAD51D, RPS20, SMAD4, STK11, TP53, TSC1, TSC2 and VHL (sequencing and deletion/duplication); AXIN2, HOXB13, MBD4, MSH3, POLD1 and POLE (sequencing only); EPCAM and GREM1 (deletion/duplication only). RNA data is routinely analyzed for use in variant interpretation for all genes.    04/21/2024 -  Chemotherapy   Patient is on Treatment Plan :  BREAST Pembrolizumab  (200) D1 + Carboplatin  (1.5) D1,8,15 + Paclitaxel  (80) D1,8,15 q21d X 4 cycles / Pembrolizumab  (200) D1 + AC D1 q21d x 4 cycles      Genetic Testing   Negative genetic testing. Report date: 04/15/2024  The [facility] CancerNext+RNAinsight Panel includes sequencing, rearrangement analysis, and RNA analysis for the following 40 genes: APC, ATM, BAP1, BARD1, BMPR1A, BRCA1, BRCA2, BRIP1, CDH1, CDKN2A, CHEK2, FH, FLCN, MET, MLH1, MSH2, MSH6, MUTYH, NF1, NTHL1, PALB2, PMS2, PTEN, RAD51C, RAD51D, RPS20, SMAD4, STK11, TP53, TSC1, TSC2 and VHL (sequencing and deletion/duplication); AXIN2, HOXB13, MBD4, MSH3, POLD1 and POLE (sequencing only); EPCAM and GREM1 (deletion/duplication only). RNA data is routinely analyzed for use in variant interpretation for all genes.      CHIEF COMPLIANT: Follow-up on chemotherapy with Taxol  and carboplatin  with Keytruda   HISTORY OF PRESENT ILLNESS:   History of Present Illness Angela Marshall is a 48 year old female undergoing chemotherapy who presents for follow-up regarding treatment side effects and work status.  She experiences heartburn and bone pain as chemotherapy side effects, with symptoms improving in the afternoon. Morning movement is difficult, and she sometimes feels dizzy with quick movements. Nausea occurs on weekends, managed with medication, and she occasionally uses tramadol  at night. Claritin is helpful for her symptoms. She maintains regular eating habits to prevent constipation. She has complete hair loss and is considering a wig. Her fingernails have darkened, managed with press-on nails. She is concerned about her FMLA running out and is considering returning to work between December and January, though she feels unable to do so currently.     ALLERGIES:  is allergic to penicillins.  MEDICATIONS:  Current Outpatient Medications  Medication Sig Dispense Refill   acetaminophen  (TYLENOL ) 500  MG tablet Take 1,000 mg by mouth every 6  (six) hours as needed for moderate pain (pain score 4-6) or mild pain (pain score 1-3).     atorvastatin (LIPITOR) 20 MG tablet Take 20 mg by mouth every evening.     cetirizine (ZYRTEC) 10 MG tablet Take 10 mg by mouth daily.     dexamethasone  (DECADRON ) 4 MG tablet Take 2 tablets daily for 2 days, start the day after chemotherapy. Take with food. 30 tablet 1   docusate sodium  (COLACE) 100 MG capsule Take 2 tablets daily until you have a normal bowel movement, then take 1 tablet daily until your bowel movements are soft and regular. 60 capsule 0   fluticasone (FLONASE) 50 MCG/ACT nasal spray Place 2 sprays into both nostrils daily as needed for allergies or rhinitis.     hydrochlorothiazide (HYDRODIURIL) 25 MG tablet Take 25 mg by mouth daily.     hydrOXYzine (ATARAX) 25 MG tablet Take 25 mg by mouth 3 (three) times daily as needed for itching or anxiety.     lidocaine -prilocaine  (EMLA ) cream Apply to affected area once 30 g 3   losartan (COZAAR) 50 MG tablet Take 50 mg by mouth daily.     omeprazole  (PRILOSEC) 40 MG capsule Take 1 capsule (40 mg total) by mouth daily. 30 capsule 1   ondansetron  (ZOFRAN ) 8 MG tablet Take 1 tablet (8 mg total) by mouth every 8 (eight) hours as needed for nausea or vomiting. Start on the third day after chemotherapy. 30 tablet 1   polyethylene glycol powder (GLYCOLAX /MIRALAX ) 17 GM/SCOOP powder Take 17 g by mouth 2 (two) times daily. Until daily soft stools OTC 238 g 0   prochlorperazine  (COMPAZINE ) 10 MG tablet Take 1 tablet (10 mg total) by mouth every 6 (six) hours as needed for nausea or vomiting. 30 tablet 1   traMADol  (ULTRAM ) 50 MG tablet Take 1 tablet (50 mg total) by mouth every 6 (six) hours as needed for moderate pain (pain score 4-6) or severe pain (pain score 7-10). 25 tablet 0   Vitamin D, Ergocalciferol, (DRISDOL) 1.25 MG (50000 UNIT) CAPS capsule Take 50,000 Units by mouth 2 (two) times a week.     No current facility-administered medications for this  visit.    PHYSICAL EXAMINATION: ECOG PERFORMANCE STATUS: 1 - Symptomatic but completely ambulatory  Vitals:   05/18/24 1003  BP: 136/87  Pulse: 81  Resp: 17  Temp: 97.9 F (36.6 C)  SpO2: 96%   Filed Weights   05/18/24 1003  Weight: 217 lb 4.8 oz (98.6 kg)      LABORATORY DATA:  I have reviewed the data as listed    Latest Ref Rng & Units 05/12/2024   12:00 PM 05/04/2024   10:40 AM 04/27/2024    9:09 AM  CMP  Glucose 70 - 99 mg/dL 98  884  890   BUN 6 - 20 mg/dL 12  14  16    Creatinine 0.44 - 1.00 mg/dL 8.86  8.90  8.78   Sodium 135 - 145 mmol/L 139  138  138   Potassium 3.5 - 5.1 mmol/L 3.5  3.9  3.4   Chloride 98 - 111 mmol/L 104  108  102   CO2 22 - 32 mmol/L 28  27  29    Calcium 8.9 - 10.3 mg/dL 9.1  8.6  9.1   Total Protein 6.5 - 8.1 g/dL 6.9  6.3  7.0   Total Bilirubin 0.0 - 1.2 mg/dL 0.5  0.5  0.9   Alkaline Phos 38 - 126 U/L 56  43  49   AST 15 - 41 U/L 23  13  11    ALT 0 - 44 U/L 38  20  18     Lab Results  Component Value Date   WBC 12.1 (H) 05/18/2024   HGB 11.5 (L) 05/18/2024   HCT 33.2 (L) 05/18/2024   MCV 86.5 05/18/2024   PLT 178 05/18/2024   NEUTROABS PENDING 05/18/2024    ASSESSMENT & PLAN:  Malignant neoplasm of upper-outer quadrant of right breast in female, estrogen receptor positive (HCC) 03/30/2024:Screening mammogram detected right upper outer quadrant mass 2.6 cm by mammogram, by ultrasound it measured 2.2 cm at 10 o'clock position, axilla negative.  Left breast: Cysts: Aspirated Biopsy: Grade 3 IDC ER 2%, PR 0%, Ki67 60%, HER2 negative   Treatment plan: Neoadjuvant chemotherapy with Taxol  carbo Keytruda  followed by Adriamycin Cytoxan Keytruda  followed by Keytruda  maintenance for 1 year started 04/21/2024 Breast conserving surgery with sentinel lymph node biopsy Adjuvant radiation ------------------------------------------------------------------------------------------------------------------------------------ Current treatment:  Cycle 4 Taxol  carbo Keytruda  Chemo toxicities: Heartburn: Managed with pantoprazole Bone pain Mild dizziness intermittently Darkening of nailbeds Nausea: Lasted several days.  We will reduce the dosage of Taxol  and carboplatin  to 70 mg meter squared  She is currently off work for chemotherapy.  She submitted paperwork for FMLA. Return to clinic weekly for chemotherapy    No orders of the defined types were placed in this encounter.  The patient has a good understanding of the overall plan. she agrees with it. she will call with any problems that may develop before the next visit here.  I personally spent a total of 30 minutes in the care of the patient today including preparing to see the patient, getting/reviewing separately obtained history, performing a medically appropriate exam/evaluation, counseling and educating, placing orders, referring and communicating with other health care professionals, documenting clinical information in the EHR, independently interpreting results, communicating results, and coordinating care.   Viinay K Honesti Seaberg, MD 05/18/24

## 2024-05-19 ENCOUNTER — Inpatient Hospital Stay: Admitting: Hematology and Oncology

## 2024-05-19 ENCOUNTER — Inpatient Hospital Stay

## 2024-05-19 ENCOUNTER — Inpatient Hospital Stay: Admitting: Licensed Clinical Social Worker

## 2024-05-19 VITALS — BP 132/88 | HR 74 | Temp 97.6°F | Resp 18

## 2024-05-19 DIAGNOSIS — Z170 Estrogen receptor positive status [ER+]: Secondary | ICD-10-CM

## 2024-05-19 DIAGNOSIS — Z5112 Encounter for antineoplastic immunotherapy: Secondary | ICD-10-CM | POA: Diagnosis not present

## 2024-05-19 LAB — T4: T4, Total: 8.3 ug/dL (ref 4.5–12.0)

## 2024-05-19 MED ORDER — DIPHENHYDRAMINE HCL 50 MG/ML IJ SOLN
50.0000 mg | Freq: Once | INTRAMUSCULAR | Status: AC
  Administered 2024-05-19: 50 mg via INTRAVENOUS
  Filled 2024-05-19: qty 1

## 2024-05-19 MED ORDER — SODIUM CHLORIDE 0.9 % IV SOLN: INTRAVENOUS | Status: DC

## 2024-05-19 MED ORDER — FAMOTIDINE IN NACL 20-0.9 MG/50ML-% IV SOLN
20.0000 mg | Freq: Once | INTRAVENOUS | Status: AC
  Administered 2024-05-19: 20 mg via INTRAVENOUS
  Filled 2024-05-19: qty 50

## 2024-05-19 MED ORDER — DEXAMETHASONE SOD PHOSPHATE PF 10 MG/ML IJ SOLN
10.0000 mg | Freq: Once | INTRAMUSCULAR | Status: AC
  Administered 2024-05-19: 10 mg via INTRAVENOUS

## 2024-05-19 MED ORDER — PALONOSETRON HCL INJECTION 0.25 MG/5ML
0.2500 mg | Freq: Once | INTRAVENOUS | Status: AC
  Administered 2024-05-19: 0.25 mg via INTRAVENOUS
  Filled 2024-05-19: qty 5

## 2024-05-19 MED ORDER — SODIUM CHLORIDE 0.9 % IV SOLN
200.0000 mg | Freq: Once | INTRAVENOUS | Status: AC
  Administered 2024-05-19: 200 mg via INTRAVENOUS
  Filled 2024-05-19: qty 200

## 2024-05-19 MED ORDER — SODIUM CHLORIDE 0.9 % IV SOLN
70.0000 mg/m2 | Freq: Once | INTRAVENOUS | Status: AC
  Administered 2024-05-19: 150 mg via INTRAVENOUS
  Filled 2024-05-19: qty 25

## 2024-05-19 MED ORDER — SODIUM CHLORIDE 0.9 % IV SOLN
150.0000 mg | Freq: Once | INTRAVENOUS | Status: AC
  Administered 2024-05-19: 150 mg via INTRAVENOUS
  Filled 2024-05-19: qty 15

## 2024-05-19 NOTE — Research (Signed)
 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 Confirmed patient does not have wounds, sores, or lesions to extremities. Patient has not had any vaccinations since last visit.  Patient states she is willing to continue the Paxman device with today's treatment.     ADVERSE EVENTS: Solicited AEs reviewed with patient. Patient reported nail color changes on nailbed of thumb on both hands. See AE table below for solicited AEs. She denies any neuropathy symptoms.    Adverse Event CTCAE Grade Onset date Resolved date Relationship to Study Intervention Action Taken Comments  Skin atrophy (solicited) 0            Skin hyperpigmentation (solicited) 0            Skin hypopigmentation (solicited) 0            Skin induration (solicited) 0            Skin ulceration (solicited) 0            Rash maculopapular (solicited) 0            Nail changes (solicited) 0            Cold intolerance (solicited) (general disorders and administration site conditions- other) 0            Frostbite (solicited) (skin and subcutaneous tissue disorders- other) 0                STUDY INTERVENTION & TOLERABILITY ASSESSMENTS: 11:55 am- Device wraps applied; pre-treatment started set on Arm 2 Continuous Compression, pressure setting 25 mmHG. 12 pm- Required 5-15 minute Tolerability check- pt states device is tolerable to compression and wraps.  12:40 pm- Taxane infusion start time. Device display moved to treatment phase. 1:42 pm Taxane infusion stop time. Device display moved to post treatment phase.   1:41 pm Device paused for bathroom break x 10 minutes and reapplied at 1:51 pm.  2:22 pm  Device stopped and wraps removed. Confirmed skin is intact and no wounds or sores.   PLAN:  The patient  was thanked for their time and participation in this study. Informed patient this research nurse will meet her for device management at next scheduled infusion Fri 05/26/24. She verbalized understanding. Patient has been provided direct contact information and is encouraged to contact this Nurse for any needs or questions.   Laury Quale, MPH  Clinical Research Coordinator

## 2024-05-19 NOTE — Patient Instructions (Signed)
 [facility] - A DEPT OF [facility]  Discharge Instructions: Thank you for choosing [facility] to provide your oncology and hematology care.   If you have a lab appointment with the Cancer Center, please go directly to the Cancer Center and check in at the registration area.   Wear comfortable clothing and clothing appropriate for easy access to any Portacath or PICC line.   We strive to give you quality time with your provider. You may need to reschedule your appointment if you arrive late (15 or more minutes).  Arriving late affects you and other patients whose appointments are after yours.  Also, if you miss three or more appointments without notifying the office, you may be dismissed from the clinic at the provider's discretion.      For prescription refill requests, have your pharmacy contact our office and allow 72 hours for refills to be completed.    Today you received the following chemotherapy and/or immunotherapy agents: Paclitaxel , Carboplatin .       To help prevent nausea and vomiting after your treatment, we encourage you to take your nausea medication as directed.  BELOW ARE SYMPTOMS THAT SHOULD BE REPORTED IMMEDIATELY: *FEVER GREATER THAN 100.4 F (38 C) OR HIGHER *CHILLS OR SWEATING *NAUSEA AND VOMITING THAT IS NOT CONTROLLED WITH YOUR NAUSEA MEDICATION *UNUSUAL SHORTNESS OF BREATH *UNUSUAL BRUISING OR BLEEDING *URINARY PROBLEMS (pain or burning when urinating, or frequent urination) *BOWEL PROBLEMS (unusual diarrhea, constipation, pain near the anus) TENDERNESS IN MOUTH AND THROAT WITH OR WITHOUT PRESENCE OF ULCERS (sore throat, sores in mouth, or a toothache) UNUSUAL RASH, SWELLING OR PAIN  UNUSUAL VAGINAL DISCHARGE OR ITCHING   Items with * indicate a potential emergency and should be followed up as soon as possible or go to the Emergency Department if any problems should occur.  Please show the CHEMOTHERAPY ALERT CARD  or IMMUNOTHERAPY ALERT CARD at check-in to the Emergency Department and triage nurse.  Should you have questions after your visit or need to cancel or reschedule your appointment, please contact [facility] - A DEPT OF [facility]  Dept: [phone]  and follow the prompts.  Office hours are 8:00 a.m. to 4:30 p.m. Monday - Friday. Please note that voicemails left after 4:00 p.m. may not be returned until the following business day.  We are closed weekends and major holidays. You have access to a nurse at all times for urgent questions. Please call the main number to the clinic Dept: [phone] and follow the prompts.   For any non-urgent questions, you may also contact your provider using MyChart. We now offer e-Visits for anyone 64 and older to request care online for non-urgent symptoms. For details visit [email or web address].   Also download the MyChart app! Go to the app store, search MyChart, open the app, select [employer], and log in with your MyChart username and password.

## 2024-05-19 NOTE — Progress Notes (Signed)
 [facility] CSW Progress Note  Visual Merchandiser met with patient to follow-up on need for community resources.    Interventions: Patient provided bills & tax info for [employer] application. CSW made copies & reminded pt of final document needed (work physicist, medical) Reminded pt of application for [employer] grant & pt began online application Pt was approved for [employer] grant      Follow Up Plan:  Patient will contact CSW with any support or resource needs. Pt will send work letter to CSW once obtained so CSW can submit [employer] application    Antawn Sison E HELANE, LCSW Clinical Social Worker [employer]

## 2024-05-19 NOTE — Progress Notes (Signed)
 Carbo dose flagged when attempting to release from tx plan. Devin, RPH stated to release per Dr Odean.

## 2024-05-21 ENCOUNTER — Other Ambulatory Visit: Payer: Self-pay

## 2024-05-22 ENCOUNTER — Encounter: Payer: Self-pay | Admitting: *Deleted

## 2024-05-24 ENCOUNTER — Inpatient Hospital Stay

## 2024-05-26 ENCOUNTER — Inpatient Hospital Stay

## 2024-05-26 ENCOUNTER — Other Ambulatory Visit (HOSPITAL_COMMUNITY): Payer: Self-pay

## 2024-05-26 ENCOUNTER — Encounter: Payer: Self-pay | Admitting: Hematology and Oncology

## 2024-05-26 ENCOUNTER — Telehealth: Payer: Self-pay | Admitting: *Deleted

## 2024-05-26 ENCOUNTER — Encounter: Payer: Self-pay | Admitting: *Deleted

## 2024-05-26 VITALS — BP 142/94 | HR 76 | Temp 97.8°F | Resp 16 | Wt 223.8 lb

## 2024-05-26 DIAGNOSIS — C50411 Malignant neoplasm of upper-outer quadrant of right female breast: Secondary | ICD-10-CM

## 2024-05-26 DIAGNOSIS — Z5112 Encounter for antineoplastic immunotherapy: Secondary | ICD-10-CM | POA: Diagnosis not present

## 2024-05-26 LAB — CBC WITH DIFFERENTIAL (CANCER CENTER ONLY)
Abs Immature Granulocytes: 0.03 K/uL (ref 0.00–0.07)
Basophils Absolute: 0 K/uL (ref 0.0–0.1)
Basophils Relative: 0 %
Eosinophils Absolute: 0 K/uL (ref 0.0–0.5)
Eosinophils Relative: 1 %
HCT: 28.7 % — ABNORMAL LOW (ref 36.0–46.0)
Hemoglobin: 9.9 g/dL — ABNORMAL LOW (ref 12.0–15.0)
Immature Granulocytes: 1 %
Lymphocytes Relative: 47 %
Lymphs Abs: 2.3 K/uL (ref 0.7–4.0)
MCH: 30.3 pg (ref 26.0–34.0)
MCHC: 34.5 g/dL (ref 30.0–36.0)
MCV: 87.8 fL (ref 80.0–100.0)
Monocytes Absolute: 0.2 K/uL (ref 0.1–1.0)
Monocytes Relative: 4 %
Neutro Abs: 2.3 K/uL (ref 1.7–7.7)
Neutrophils Relative %: 47 %
Platelet Count: 161 K/uL (ref 150–400)
RBC: 3.27 MIL/uL — ABNORMAL LOW (ref 3.87–5.11)
RDW: 14.1 % (ref 11.5–15.5)
WBC Count: 4.9 K/uL (ref 4.0–10.5)
nRBC: 0 % (ref 0.0–0.2)

## 2024-05-26 LAB — CMP (CANCER CENTER ONLY)
ALT: 24 U/L (ref 0–44)
AST: 16 U/L (ref 15–41)
Albumin: 3.8 g/dL (ref 3.5–5.0)
Alkaline Phosphatase: 63 U/L (ref 38–126)
Anion gap: 9 (ref 5–15)
BUN: 17 mg/dL (ref 6–20)
CO2: 26 mmol/L (ref 22–32)
Calcium: 8.8 mg/dL — ABNORMAL LOW (ref 8.9–10.3)
Chloride: 106 mmol/L (ref 98–111)
Creatinine: 1.17 mg/dL — ABNORMAL HIGH (ref 0.44–1.00)
GFR, Estimated: 57 mL/min — ABNORMAL LOW (ref >60)
Glucose, Bld: 108 mg/dL — ABNORMAL HIGH (ref 70–99)
Potassium: 3.4 mmol/L — ABNORMAL LOW (ref 3.5–5.1)
Sodium: 141 mmol/L (ref 135–145)
Total Bilirubin: 0.4 mg/dL (ref 0.0–1.2)
Total Protein: 6.4 g/dL — ABNORMAL LOW (ref 6.5–8.1)

## 2024-05-26 MED ORDER — SODIUM CHLORIDE 0.9 % IV SOLN
150.0000 mg | Freq: Once | INTRAVENOUS | Status: AC
  Administered 2024-05-26: 150 mg via INTRAVENOUS
  Filled 2024-05-26: qty 15

## 2024-05-26 MED ORDER — FAMOTIDINE IN NACL 20-0.9 MG/50ML-% IV SOLN
20.0000 mg | Freq: Once | INTRAVENOUS | Status: AC
  Administered 2024-05-26: 20 mg via INTRAVENOUS
  Filled 2024-05-26: qty 50

## 2024-05-26 MED ORDER — DEXAMETHASONE SOD PHOSPHATE PF 10 MG/ML IJ SOLN
10.0000 mg | Freq: Once | INTRAMUSCULAR | Status: AC
  Administered 2024-05-26: 10 mg via INTRAVENOUS

## 2024-05-26 MED ORDER — SODIUM CHLORIDE 0.9 % IV SOLN: INTRAVENOUS | Status: DC

## 2024-05-26 MED ORDER — SODIUM CHLORIDE 0.9 % IV SOLN
70.0000 mg/m2 | Freq: Once | INTRAVENOUS | Status: AC
  Administered 2024-05-26: 150 mg via INTRAVENOUS
  Filled 2024-05-26: qty 25

## 2024-05-26 MED ORDER — PALONOSETRON HCL INJECTION 0.25 MG/5ML
0.2500 mg | Freq: Once | INTRAVENOUS | Status: AC
  Administered 2024-05-26: 0.25 mg via INTRAVENOUS
  Filled 2024-05-26: qty 5

## 2024-05-26 MED ORDER — DIPHENHYDRAMINE HCL 50 MG/ML IJ SOLN
50.0000 mg | Freq: Once | INTRAMUSCULAR | Status: AC
  Administered 2024-05-26: 50 mg via INTRAVENOUS
  Filled 2024-05-26: qty 1

## 2024-05-26 NOTE — Telephone Encounter (Signed)
 Completed ADA paperwork   Sent to provider to review, amend, sign and return to this nurse to return to claims benefit manager.

## 2024-05-26 NOTE — Research (Signed)
 D7794, ICE COMPRESS: RANDOMIZED TRIAL OF LIMB CRYOCOMPRESSION VERSUS CONTINUOUS COMPRESSION VERSUS LOW CYCLIC COMPRESSION FOR THE PREVENTION OF TAXANE-INDUCED PERIPHERAL NEUROPATHY   Arm 2 Continuous Compression-Treatment 5 Angela Marshall arrives today unaccompanied for treatment 5. Confirmed Angela Marshall does not have wounds, sores, or lesions to extremities. Angela Marshall has not had any vaccinations since last visit.  Angela Marshall states she wants to continue the Paxman device with today's treatment.     ADVERSE EVENTS: Solicited AEs reviewed with Angela Marshall. Angela Marshall reported ongoing nail color changes on nailbed of thumb on both hands. See AE table below for solicited AEs. She denies any neuropathy symptoms.   Treatment 4 -05/19/24-05/26/24 Adverse Event CTCAE Grade Onset date Resolved date Relationship to Study Intervention Action Taken Comments  Skin atrophy (solicited) 0            Skin hyperpigmentation (solicited) 0            Skin hypopigmentation (solicited) 0            Skin induration (solicited) 0            Skin ulceration (solicited) 0            Rash maculopapular (solicited) 0            Nail changes (solicited) 0            Cold intolerance (solicited) (general disorders and administration site conditions- other) 0            Frostbite (solicited) (skin and subcutaneous tissue disorders- other) 0            Nail Discoloration 1 05/19/24 ongoing unrelated none Pt reports started after treatment 3      STUDY INTERVENTION & TOLERABILITY ASSESSMENTS: 9:48 am- Device wraps applied; pre-treatment started set on Arm 2 Continuous Compression, pressure setting 25 mmHG. 9:55 am- Required 5-15 minute Tolerability check- pt states device is tolerable to compression and wraps.  11:34 am- Taxane infusion start time. Device display moved to treatment phase.  11:35 am Device paused for bathroom break x 8 minutes and reapplied at 11:38 am 12:13 pm  Device stopped and wraps removed. Confirmed skin is intact and no  wounds or sores.   PLAN:  The Angela Marshall was thanked for their ongoing participation in this study. Informed Angela Marshall this research nurse will meet her before her next infusion appointment on 06/02/24 to complete questionnaires and neuropathy assessments for the 6 weeks time point on study. Plan to use the Paxman device again during her next infusion. She verbalized understanding and agreement to this plan. Angela Marshall has been provided direct contact information and is encouraged to contact this Nurse for any needs or questions.  Cherylyn Hoard, BSN, RN, [employer] Research Nurse II [phone] 05/26/2024

## 2024-05-29 ENCOUNTER — Encounter: Payer: Self-pay | Admitting: Licensed Clinical Social Worker

## 2024-05-29 NOTE — Progress Notes (Signed)
 [facility] CSW Progress Note  Visual Merchandiser received final document needed for the [employer]  Submitted full application via mail today. Notified pt of submission and how the organization will communicate next steps.      Athan Casalino E Roderick Sweezy, LCSW Clinical Social Worker [facility]

## 2024-05-30 NOTE — Telephone Encounter (Addendum)
  Signed paperwork returned to this nurse.   Successfully returned via fax..   No request for medical records.  Copy to [facility] HIM bin designated for items to be scanned to EMR.   Prepared paperwork pick up .   Connected with .Neli M Lamadrid [phone]) for patient notification. Process completed with no further instructions received, actions performed or required by this nurse.

## 2024-05-31 ENCOUNTER — Inpatient Hospital Stay: Attending: Hematology and Oncology

## 2024-05-31 ENCOUNTER — Inpatient Hospital Stay

## 2024-05-31 VITALS — BP 148/84 | HR 84 | Temp 98.2°F | Resp 18

## 2024-05-31 DIAGNOSIS — Z87891 Personal history of nicotine dependence: Secondary | ICD-10-CM | POA: Diagnosis not present

## 2024-05-31 DIAGNOSIS — Z79633 Long term (current) use of mitotic inhibitor: Secondary | ICD-10-CM | POA: Insufficient documentation

## 2024-05-31 DIAGNOSIS — Z5111 Encounter for antineoplastic chemotherapy: Secondary | ICD-10-CM | POA: Insufficient documentation

## 2024-05-31 DIAGNOSIS — Z1732 Human epidermal growth factor receptor 2 negative status: Secondary | ICD-10-CM | POA: Diagnosis not present

## 2024-05-31 DIAGNOSIS — Z79620 Long term (current) use of immunosuppressive biologic: Secondary | ICD-10-CM | POA: Diagnosis not present

## 2024-05-31 DIAGNOSIS — Z79899 Other long term (current) drug therapy: Secondary | ICD-10-CM | POA: Diagnosis not present

## 2024-05-31 DIAGNOSIS — Z803 Family history of malignant neoplasm of breast: Secondary | ICD-10-CM | POA: Insufficient documentation

## 2024-05-31 DIAGNOSIS — Z8041 Family history of malignant neoplasm of ovary: Secondary | ICD-10-CM | POA: Diagnosis not present

## 2024-05-31 DIAGNOSIS — Z635 Disruption of family by separation and divorce: Secondary | ICD-10-CM | POA: Diagnosis not present

## 2024-05-31 DIAGNOSIS — Z79630 Long term (current) use of alkylating agent: Secondary | ICD-10-CM | POA: Diagnosis not present

## 2024-05-31 DIAGNOSIS — R040 Epistaxis: Secondary | ICD-10-CM | POA: Insufficient documentation

## 2024-05-31 DIAGNOSIS — N6002 Solitary cyst of left breast: Secondary | ICD-10-CM | POA: Diagnosis not present

## 2024-05-31 DIAGNOSIS — Z170 Estrogen receptor positive status [ER+]: Secondary | ICD-10-CM | POA: Insufficient documentation

## 2024-05-31 DIAGNOSIS — G620 Drug-induced polyneuropathy: Secondary | ICD-10-CM | POA: Insufficient documentation

## 2024-05-31 DIAGNOSIS — Z808 Family history of malignant neoplasm of other organs or systems: Secondary | ICD-10-CM | POA: Diagnosis not present

## 2024-05-31 DIAGNOSIS — R197 Diarrhea, unspecified: Secondary | ICD-10-CM | POA: Diagnosis not present

## 2024-05-31 DIAGNOSIS — D759 Disease of blood and blood-forming organs, unspecified: Secondary | ICD-10-CM | POA: Diagnosis not present

## 2024-05-31 DIAGNOSIS — Z1722 Progesterone receptor negative status: Secondary | ICD-10-CM | POA: Insufficient documentation

## 2024-05-31 DIAGNOSIS — Y92009 Unspecified place in unspecified non-institutional (private) residence as the place of occurrence of the external cause: Secondary | ICD-10-CM | POA: Diagnosis not present

## 2024-05-31 DIAGNOSIS — T451X5A Adverse effect of antineoplastic and immunosuppressive drugs, initial encounter: Secondary | ICD-10-CM | POA: Insufficient documentation

## 2024-05-31 DIAGNOSIS — C50411 Malignant neoplasm of upper-outer quadrant of right female breast: Secondary | ICD-10-CM | POA: Diagnosis present

## 2024-05-31 DIAGNOSIS — Z880 Allergy status to penicillin: Secondary | ICD-10-CM | POA: Diagnosis not present

## 2024-05-31 DIAGNOSIS — Z5112 Encounter for antineoplastic immunotherapy: Secondary | ICD-10-CM | POA: Insufficient documentation

## 2024-05-31 MED ORDER — FILGRASTIM-SNDZ 480 MCG/0.8ML IJ SOSY
480.0000 ug | PREFILLED_SYRINGE | Freq: Once | INTRAMUSCULAR | Status: AC
  Administered 2024-05-31: 480 ug via SUBCUTANEOUS
  Filled 2024-05-31: qty 0.8

## 2024-06-01 ENCOUNTER — Inpatient Hospital Stay: Admitting: Hematology and Oncology

## 2024-06-01 ENCOUNTER — Inpatient Hospital Stay: Attending: Hematology and Oncology

## 2024-06-02 ENCOUNTER — Inpatient Hospital Stay: Attending: Hematology and Oncology | Admitting: *Deleted

## 2024-06-02 ENCOUNTER — Inpatient Hospital Stay

## 2024-06-02 VITALS — BP 141/86 | HR 88 | Temp 98.6°F | Resp 18 | Wt 220.5 lb

## 2024-06-02 DIAGNOSIS — Z170 Estrogen receptor positive status [ER+]: Secondary | ICD-10-CM

## 2024-06-02 DIAGNOSIS — Z5112 Encounter for antineoplastic immunotherapy: Secondary | ICD-10-CM | POA: Diagnosis not present

## 2024-06-02 LAB — CBC WITH DIFFERENTIAL (CANCER CENTER ONLY)
Abs Immature Granulocytes: 0.56 K/uL — ABNORMAL HIGH (ref 0.00–0.07)
Basophils Absolute: 0.1 K/uL (ref 0.0–0.1)
Basophils Relative: 1 %
Eosinophils Absolute: 0 K/uL (ref 0.0–0.5)
Eosinophils Relative: 1 %
HCT: 29.2 % — ABNORMAL LOW (ref 36.0–46.0)
Hemoglobin: 10 g/dL — ABNORMAL LOW (ref 12.0–15.0)
Immature Granulocytes: 7 %
Lymphocytes Relative: 38 %
Lymphs Abs: 2.9 K/uL (ref 0.7–4.0)
MCH: 29.9 pg (ref 26.0–34.0)
MCHC: 34.2 g/dL (ref 30.0–36.0)
MCV: 87.4 fL (ref 80.0–100.0)
Monocytes Absolute: 0.3 K/uL (ref 0.1–1.0)
Monocytes Relative: 4 %
Neutro Abs: 3.7 K/uL (ref 1.7–7.7)
Neutrophils Relative %: 49 %
Platelet Count: 228 K/uL (ref 150–400)
RBC: 3.34 MIL/uL — ABNORMAL LOW (ref 3.87–5.11)
RDW: 14.6 % (ref 11.5–15.5)
WBC Count: 7.6 K/uL (ref 4.0–10.5)
nRBC: 0.4 % — ABNORMAL HIGH (ref 0.0–0.2)

## 2024-06-02 LAB — CMP (CANCER CENTER ONLY)
ALT: 27 U/L (ref 0–44)
AST: 20 U/L (ref 15–41)
Albumin: 4.1 g/dL (ref 3.5–5.0)
Alkaline Phosphatase: 63 U/L (ref 38–126)
Anion gap: 10 (ref 5–15)
BUN: 11 mg/dL (ref 6–20)
CO2: 26 mmol/L (ref 22–32)
Calcium: 9 mg/dL (ref 8.9–10.3)
Chloride: 103 mmol/L (ref 98–111)
Creatinine: 1.05 mg/dL — ABNORMAL HIGH (ref 0.44–1.00)
GFR, Estimated: >60 mL/min (ref >60)
Glucose, Bld: 115 mg/dL — ABNORMAL HIGH (ref 70–99)
Potassium: 3.3 mmol/L — ABNORMAL LOW (ref 3.5–5.1)
Sodium: 138 mmol/L (ref 135–145)
Total Bilirubin: 0.6 mg/dL (ref 0.0–1.2)
Total Protein: 6.8 g/dL (ref 6.5–8.1)

## 2024-06-02 MED ORDER — DIPHENHYDRAMINE HCL 50 MG/ML IJ SOLN
50.0000 mg | Freq: Once | INTRAMUSCULAR | Status: AC
  Administered 2024-06-02: 50 mg via INTRAVENOUS
  Filled 2024-06-02: qty 1

## 2024-06-02 MED ORDER — DEXAMETHASONE SOD PHOSPHATE PF 10 MG/ML IJ SOLN
10.0000 mg | Freq: Once | INTRAMUSCULAR | Status: AC
  Administered 2024-06-02: 10 mg via INTRAVENOUS

## 2024-06-02 MED ORDER — SODIUM CHLORIDE 0.9 % IV SOLN
70.0000 mg/m2 | Freq: Once | INTRAVENOUS | Status: AC
  Administered 2024-06-02: 150 mg via INTRAVENOUS
  Filled 2024-06-02: qty 25

## 2024-06-02 MED ORDER — FAMOTIDINE IN NACL 20-0.9 MG/50ML-% IV SOLN
20.0000 mg | Freq: Once | INTRAVENOUS | Status: AC
  Administered 2024-06-02: 20 mg via INTRAVENOUS
  Filled 2024-06-02: qty 50

## 2024-06-02 MED ORDER — SODIUM CHLORIDE 0.9 % IV SOLN: INTRAVENOUS | Status: DC

## 2024-06-02 MED ORDER — PALONOSETRON HCL INJECTION 0.25 MG/5ML
0.2500 mg | Freq: Once | INTRAVENOUS | Status: AC
  Administered 2024-06-02: 0.25 mg via INTRAVENOUS
  Filled 2024-06-02: qty 5

## 2024-06-02 MED ORDER — SODIUM CHLORIDE 0.9 % IV SOLN
150.0000 mg | Freq: Once | INTRAVENOUS | Status: AC
  Administered 2024-06-02: 150 mg via INTRAVENOUS
  Filled 2024-06-02: qty 15

## 2024-06-02 NOTE — Research (Signed)
 D7794, ICE COMPRESS: RANDOMIZED TRIAL OF LIMB CRYOCOMPRESSION VERSUS CONTINUOUS COMPRESSION VERSUS LOW CYCLIC COMPRESSION FOR THE PREVENTION OF TAXANE-INDUCED PERIPHERAL NEUROPATHY   Arm 2 Continuous Compression-Treatment 6 and Week 6 Assessments Patient arrives today unaccompanied for treatment 6. Confirmed patient does not have wounds, sores, or lesions to extremities. Patient has not had any vaccinations since last visit.  Patient states she wants to continue the Paxman device with today's treatment.     PROs Provided PROs to patient at registration prior to her other appointments. Collected and checked for completeness and accuracy.   Neuropathy Assessment Neuropathy assessment, including neuropen, tuning fork and Get up & Go test, completed by this Research RN per protocol.   Supplemental Agents Patient denies taking Duloxetine for any reason. Pt denies any treatments prescribed for symptom management of peripheral neuropathy since the start of the study. Pt denies taking vitamins and/or supplements for symptom management of peripheral neuropathy. Pt denies receiving any complimentary alternative therapies for symptom management of peripheral neuropathy.   Adverse Events Solicited AEs reviewed with patient. She did not have any device related AEs.  She reports mild intermittent numbness with occasional tingling felt several times a day in her fingertips and toes. She says it does not interfere with her ability to perform any personal or instrumental ADLs.  Patient denies any motor neuropathy symptoms.  Patient does report ongoing discoloration in the nail beds of right and left hand. See AE table below.   Treatment 5 (05/26/24-06/02/24) Adverse Event CTCAE Grade Onset date Resolved date Relationship to Study Intervention Relationship to Taxane Action Taken Comments  Skin atrophy (solicited) 0             Skin hyperpigmentation (solicited) 0             Skin hypopigmentation (solicited) 0              Skin induration (solicited) 0             Skin ulceration (solicited) 0             Rash maculopapular (solicited) 0             Nail changes (solicited) 0             Cold intolerance (solicited) (general disorders and administration site conditions- other) 0             Frostbite (solicited) (skin and subcutaneous tissue disorders- other) 0             Neuropathy Sensory 1 05/27/24 ongoing unrelated definite none Pt reports started after treatment 5  Nail Discoloration 1 05/19/24 ongoing unrelated definite none Pt reports started after treatment 3      STUDY INTERVENTION & TOLERABILITY ASSESSMENTS: 3:15 pm- Device wraps applied; pre-treatment started set on Arm 2 Continuous Compression, pressure setting 25 mmHG. 3:20 pm- Required 5-15 minute Tolerability check- pt states device is tolerable to compression and wraps.  3:47 pm- Taxane infusion start time. Device display moved to treatment phase.  4:39 pm-  Wraps removed and device paused for bathroom break x 6 minutes. Re-wrapped and restarted at 4:45 pm. 4:49 pm- Taxane infusion end time. Device display moved to post treatment phase 5:28 pm  Device stopped and wraps removed. Confirmed skin is intact and no wounds or sores.   PLAN:  Patient was thanked for their ongoing participation in this study. Informed patient this research nurse will meet her at her next infusion appointment on 06/09/24 to use the Paxman  device again. She verbalized understanding and agreement to this plan. Patient has been provided direct contact information and is encouraged to contact this Nurse for any needs or questions.  Cherylyn Hoard, BSN, RN, [employer] Research Nurse II [phone] 06/02/2024

## 2024-06-02 NOTE — Patient Instructions (Signed)
 [facility] - A DEPT OF [facility]  Discharge Instructions: Thank you for choosing [facility] to provide your oncology and hematology care.   If you have a lab appointment with the Cancer Center, please go directly to the Cancer Center and check in at the registration area.   Wear comfortable clothing and clothing appropriate for easy access to any Portacath or PICC line.   We strive to give you quality time with your provider. You may need to reschedule your appointment if you arrive late (15 or more minutes).  Arriving late affects you and other patients whose appointments are after yours.  Also, if you miss three or more appointments without notifying the office, you may be dismissed from the clinic at the provider's discretion.      For prescription refill requests, have your pharmacy contact our office and allow 72 hours for refills to be completed.    Today you received the following chemotherapy and/or immunotherapy agents: Paclitaxel  (Taxol ) & Carboplatin  (Paraplatin )      To help prevent nausea and vomiting after your treatment, we encourage you to take your nausea medication as directed.  BELOW ARE SYMPTOMS THAT SHOULD BE REPORTED IMMEDIATELY: *FEVER GREATER THAN 100.4 F (38 C) OR HIGHER *CHILLS OR SWEATING *NAUSEA AND VOMITING THAT IS NOT CONTROLLED WITH YOUR NAUSEA MEDICATION *UNUSUAL SHORTNESS OF BREATH *UNUSUAL BRUISING OR BLEEDING *URINARY PROBLEMS (pain or burning when urinating, or frequent urination) *BOWEL PROBLEMS (unusual diarrhea, constipation, pain near the anus) TENDERNESS IN MOUTH AND THROAT WITH OR WITHOUT PRESENCE OF ULCERS (sore throat, sores in mouth, or a toothache) UNUSUAL RASH, SWELLING OR PAIN  UNUSUAL VAGINAL DISCHARGE OR ITCHING   Items with * indicate a potential emergency and should be followed up as soon as possible or go to the Emergency Department if any problems should occur.  Please show the  CHEMOTHERAPY ALERT CARD or IMMUNOTHERAPY ALERT CARD at check-in to the Emergency Department and triage nurse.  Should you have questions after your visit or need to cancel or reschedule your appointment, please contact [facility] - A DEPT OF [facility]  Dept: [phone]  and follow the prompts.  Office hours are 8:00 a.m. to 4:30 p.m. Monday - Friday. Please note that voicemails left after 4:00 p.m. may not be returned until the following business day.  We are closed weekends and major holidays. You have access to a nurse at all times for urgent questions. Please call the main number to the clinic Dept: [phone] and follow the prompts.   For any non-urgent questions, you may also contact your provider using MyChart. We now offer e-Visits for anyone 41 and older to request care online for non-urgent symptoms. For details visit [email or web address].   Also download the MyChart app! Go to the app store, search MyChart, open the app, select [place], and log in with your MyChart username and password.

## 2024-06-03 ENCOUNTER — Inpatient Hospital Stay

## 2024-06-03 VITALS — BP 134/85 | HR 82 | Temp 98.9°F | Resp 20

## 2024-06-03 DIAGNOSIS — Z5112 Encounter for antineoplastic immunotherapy: Secondary | ICD-10-CM | POA: Diagnosis not present

## 2024-06-03 DIAGNOSIS — C50411 Malignant neoplasm of upper-outer quadrant of right female breast: Secondary | ICD-10-CM

## 2024-06-03 MED ORDER — FILGRASTIM-SNDZ 480 MCG/0.8ML IJ SOSY
480.0000 ug | PREFILLED_SYRINGE | Freq: Once | INTRAMUSCULAR | Status: AC
  Administered 2024-06-03: 480 ug via SUBCUTANEOUS
  Filled 2024-06-03: qty 0.8

## 2024-06-05 ENCOUNTER — Inpatient Hospital Stay

## 2024-06-05 DIAGNOSIS — Z5112 Encounter for antineoplastic immunotherapy: Secondary | ICD-10-CM | POA: Diagnosis not present

## 2024-06-05 DIAGNOSIS — Z170 Estrogen receptor positive status [ER+]: Secondary | ICD-10-CM

## 2024-06-05 MED ORDER — FILGRASTIM-SNDZ 480 MCG/0.8ML IJ SOSY
480.0000 ug | PREFILLED_SYRINGE | Freq: Once | INTRAMUSCULAR | Status: AC
  Administered 2024-06-05: 480 ug via SUBCUTANEOUS
  Filled 2024-06-05: qty 0.8

## 2024-06-06 ENCOUNTER — Inpatient Hospital Stay

## 2024-06-06 ENCOUNTER — Encounter: Payer: Self-pay | Admitting: Hematology and Oncology

## 2024-06-06 VITALS — BP 114/70 | HR 88 | Temp 98.0°F | Resp 19

## 2024-06-06 DIAGNOSIS — Z170 Estrogen receptor positive status [ER+]: Secondary | ICD-10-CM

## 2024-06-06 DIAGNOSIS — Z5112 Encounter for antineoplastic immunotherapy: Secondary | ICD-10-CM | POA: Diagnosis not present

## 2024-06-06 MED ORDER — FILGRASTIM-SNDZ 480 MCG/0.8ML IJ SOSY
480.0000 ug | PREFILLED_SYRINGE | Freq: Once | INTRAMUSCULAR | Status: AC
  Administered 2024-06-06: 480 ug via SUBCUTANEOUS
  Filled 2024-06-06: qty 0.8

## 2024-06-09 ENCOUNTER — Ambulatory Visit: Payer: Self-pay

## 2024-06-09 ENCOUNTER — Inpatient Hospital Stay

## 2024-06-09 ENCOUNTER — Other Ambulatory Visit: Payer: Self-pay | Admitting: Adult Health

## 2024-06-09 ENCOUNTER — Encounter: Payer: Self-pay | Admitting: *Deleted

## 2024-06-09 VITALS — BP 148/97 | HR 82 | Temp 98.3°F | Resp 18 | Wt 222.4 lb

## 2024-06-09 DIAGNOSIS — Z5112 Encounter for antineoplastic immunotherapy: Secondary | ICD-10-CM | POA: Diagnosis not present

## 2024-06-09 DIAGNOSIS — C50411 Malignant neoplasm of upper-outer quadrant of right female breast: Secondary | ICD-10-CM

## 2024-06-09 LAB — CMP (CANCER CENTER ONLY)
ALT: 41 U/L (ref 0–44)
AST: 28 U/L (ref 15–41)
Albumin: 4.1 g/dL (ref 3.5–5.0)
Alkaline Phosphatase: 65 U/L (ref 38–126)
Anion gap: 10 (ref 5–15)
BUN: 13 mg/dL (ref 6–20)
CO2: 26 mmol/L (ref 22–32)
Calcium: 9.2 mg/dL (ref 8.9–10.3)
Chloride: 104 mmol/L (ref 98–111)
Creatinine: 1.15 mg/dL — ABNORMAL HIGH (ref 0.44–1.00)
GFR, Estimated: 58 mL/min — ABNORMAL LOW (ref >60)
Glucose, Bld: 116 mg/dL — ABNORMAL HIGH (ref 70–99)
Potassium: 3.1 mmol/L — ABNORMAL LOW (ref 3.5–5.1)
Sodium: 140 mmol/L (ref 135–145)
Total Bilirubin: 0.4 mg/dL (ref 0.0–1.2)
Total Protein: 6.8 g/dL (ref 6.5–8.1)

## 2024-06-09 LAB — CBC WITH DIFFERENTIAL (CANCER CENTER ONLY)
Abs Immature Granulocytes: 0.78 K/uL — ABNORMAL HIGH (ref 0.00–0.07)
Basophils Absolute: 0 K/uL (ref 0.0–0.1)
Basophils Relative: 0 %
Eosinophils Absolute: 0 K/uL (ref 0.0–0.5)
Eosinophils Relative: 0 %
HCT: 28.3 % — ABNORMAL LOW (ref 36.0–46.0)
Hemoglobin: 10 g/dL — ABNORMAL LOW (ref 12.0–15.0)
Immature Granulocytes: 14 %
Lymphocytes Relative: 51 %
Lymphs Abs: 3 K/uL (ref 0.7–4.0)
MCH: 30.9 pg (ref 26.0–34.0)
MCHC: 35.3 g/dL (ref 30.0–36.0)
MCV: 87.3 fL (ref 80.0–100.0)
Monocytes Absolute: 0.6 K/uL (ref 0.1–1.0)
Monocytes Relative: 10 %
Neutro Abs: 1.4 K/uL — ABNORMAL LOW (ref 1.7–7.7)
Neutrophils Relative %: 25 %
Platelet Count: 213 K/uL (ref 150–400)
RBC: 3.24 MIL/uL — ABNORMAL LOW (ref 3.87–5.11)
RDW: 15.4 % (ref 11.5–15.5)
WBC Count: 5.8 K/uL (ref 4.0–10.5)
nRBC: 3.3 % — ABNORMAL HIGH (ref 0.0–0.2)

## 2024-06-09 LAB — TSH: TSH: 1.38 u[IU]/mL (ref 0.350–4.500)

## 2024-06-09 MED ORDER — POTASSIUM CHLORIDE CRYS ER 10 MEQ PO TBCR: 10.0000 meq | EXTENDED_RELEASE_TABLET | Freq: Every day | ORAL | 0 refills | Status: DC

## 2024-06-09 MED ORDER — SODIUM CHLORIDE 0.9 % IV SOLN: INTRAVENOUS | Status: DC

## 2024-06-09 MED ORDER — SODIUM CHLORIDE 0.9 % IV SOLN
70.0000 mg/m2 | Freq: Once | INTRAVENOUS | Status: AC
  Administered 2024-06-09: 150 mg via INTRAVENOUS
  Filled 2024-06-09: qty 25

## 2024-06-09 MED ORDER — FAMOTIDINE IN NACL 20-0.9 MG/50ML-% IV SOLN
20.0000 mg | Freq: Once | INTRAVENOUS | Status: AC
  Administered 2024-06-09: 20 mg via INTRAVENOUS
  Filled 2024-06-09: qty 50

## 2024-06-09 MED ORDER — DEXAMETHASONE SOD PHOSPHATE PF 10 MG/ML IJ SOLN
10.0000 mg | Freq: Once | INTRAMUSCULAR | Status: AC
  Administered 2024-06-09: 10 mg via INTRAVENOUS

## 2024-06-09 MED ORDER — SODIUM CHLORIDE 0.9 % IV SOLN
200.0000 mg | Freq: Once | INTRAVENOUS | Status: AC
  Administered 2024-06-09: 200 mg via INTRAVENOUS
  Filled 2024-06-09: qty 200

## 2024-06-09 MED ORDER — SODIUM CHLORIDE 0.9 % IV SOLN
150.0000 mg | Freq: Once | INTRAVENOUS | Status: AC
  Administered 2024-06-09: 150 mg via INTRAVENOUS
  Filled 2024-06-09: qty 15

## 2024-06-09 MED ORDER — DIPHENHYDRAMINE HCL 50 MG/ML IJ SOLN
50.0000 mg | Freq: Once | INTRAMUSCULAR | Status: AC
  Administered 2024-06-09: 50 mg via INTRAVENOUS
  Filled 2024-06-09: qty 1

## 2024-06-09 MED ORDER — PALONOSETRON HCL INJECTION 0.25 MG/5ML
0.2500 mg | Freq: Once | INTRAVENOUS | Status: AC
  Administered 2024-06-09: 0.25 mg via INTRAVENOUS
  Filled 2024-06-09: qty 5

## 2024-06-09 NOTE — Patient Instructions (Addendum)
 [facility] - A DEPT OF [facility]  Discharge Instructions: Thank you for choosing [facility] to provide your oncology and hematology care.   If you have a lab appointment with the Cancer Center, please go directly to the Cancer Center and check in at the registration area.   Wear comfortable clothing and clothing appropriate for easy access to any Portacath or PICC line.   We strive to give you quality time with your provider. You may need to reschedule your appointment if you arrive late (15 or more minutes).  Arriving late affects you and other patients whose appointments are after yours.  Also, if you miss three or more appointments without notifying the office, you may be dismissed from the clinic at the providers discretion.      For prescription refill requests, have your pharmacy contact our office and allow 72 hours for refills to be completed.    Today you received the following chemotherapy and/or immunotherapy agents Paclitaxel  (Taxol ) & Pembrolizumab  (Keytruda ) & Carboplatin  (Paraplatin )    To help prevent nausea and vomiting after your treatment, we encourage you to take your nausea medication as directed.  BELOW ARE SYMPTOMS THAT SHOULD BE REPORTED IMMEDIATELY: *FEVER GREATER THAN 100.4 F (38 C) OR HIGHER *CHILLS OR SWEATING *NAUSEA AND VOMITING THAT IS NOT CONTROLLED WITH YOUR NAUSEA MEDICATION *UNUSUAL SHORTNESS OF BREATH *UNUSUAL BRUISING OR BLEEDING *URINARY PROBLEMS (pain or burning when urinating, or frequent urination) *BOWEL PROBLEMS (unusual diarrhea, constipation, pain near the anus) TENDERNESS IN MOUTH AND THROAT WITH OR WITHOUT PRESENCE OF ULCERS (sore throat, sores in mouth, or a toothache) UNUSUAL RASH, SWELLING OR PAIN  UNUSUAL VAGINAL DISCHARGE OR ITCHING   Items with * indicate a potential emergency and should be followed up as soon as possible or go to the Emergency Department if any problems should  occur.  Please show the CHEMOTHERAPY ALERT CARD or IMMUNOTHERAPY ALERT CARD at check-in to the Emergency Department and triage nurse.  Should you have questions after your visit or need to cancel or reschedule your appointment, please contact [facility] - [facility]  Dept: [phone]  and follow the prompts.  Office hours are 8:00 a.m. to 4:30 p.m. Monday - Friday. Please note that voicemails left after 4:00 p.m. may not be returned until the following business day.  We are closed weekends and major holidays. You have access to a nurse at all times for urgent questions. Please call the main number to the clinic Dept: [phone] and follow the prompts.   For any non-urgent questions, you may also contact your provider using MyChart. We now offer e-Visits for anyone 72 and older to request care online for non-urgent symptoms. For details visit [email or web address].   Also download the MyChart app! Go to the app store, search MyChart, open the app, select [place], and log in with your MyChart username and password.

## 2024-06-09 NOTE — Research (Signed)
 D7794, ICE COMPRESS: RANDOMIZED TRIAL OF LIMB CRYOCOMPRESSION VERSUS CONTINUOUS COMPRESSION VERSUS LOW CYCLIC COMPRESSION FOR THE PREVENTION OF TAXANE-INDUCED PERIPHERAL NEUROPATHY   Arm 2 Continuous Compression-Treatment 7 Patient arrives today unaccompanied for treatment 7. Confirmed patient does not have wounds, sores, or lesions to extremities. Patient has not had any vaccinations since last visit.  Patient states she wants to continue the Paxman device with today's treatment.      Adverse Events Solicited AEs reviewed with patient. She did not have any device related AEs.  She reports ongoing mild numbness with occasional tingling felt several times a day in her fingertips and toes. Patient says it may be a little worse this week as she has maybe noticed it a little more often but it still does not interfere with her ability to perform any personal or instrumental ADLs.  Patient denies any motor neuropathy symptoms.  Patient does report ongoing discoloration in the nail beds of right and left hands. See AE table below.   Treatment 6 (06/02/24-06/09/24) Adverse Event CTCAE Grade Onset date Resolved date Relationship to Study Intervention Relationship to Taxane Action Taken Comments  Skin atrophy (solicited) 0             Skin hyperpigmentation (solicited) 0             Skin hypopigmentation (solicited) 0             Skin induration (solicited) 0             Skin ulceration (solicited) 0             Rash maculopapular (solicited) 0             Nail changes (solicited) 0             Cold intolerance (solicited) (general disorders and administration site conditions- other) 0             Frostbite (solicited) (skin and subcutaneous tissue disorders- other) 0             Neuropathy Sensory 1 05/27/24 ongoing unrelated definite none Pt reports started after treatment 5  Nail Discoloration 1 05/19/24 ongoing unrelated definite none Pt reports started after treatment 3      STUDY INTERVENTION &  TOLERABILITY ASSESSMENTS: 12:45 pm- Device wraps applied; pre-treatment started set on Arm 2 Continuous Compression, pressure setting 25 mmHG. 12:50 pm- Required 5-15 minute Tolerability check- pt states device is tolerable to compression and wraps.  1:24 pm- Taxane infusion start time. Device display moved to treatment phase.  2:15 pm- Wraps removed and device paused for bathroom break x 10 minutes. Re-wrapped and restarted at 2:25 pm. 2:32 pm- Taxane infusion end time. Device display moved to post treatment phase 3:15 pm  Device stopped and wraps removed. Confirmed skin is intact and no wounds or sores.   PLAN:  Patient was thanked for their ongoing participation in this study. Informed patient this research nurse will meet her at her next infusion appointment on 06/16/24 to use the Paxman device again. She verbalized understanding and agreement to this plan. Patient has been provided direct contact information and is encouraged to contact this Nurse for any needs or questions.  Cherylyn Hoard, BSN, RN, [employer] Research Nurse II [phone] 06/09/2024

## 2024-06-10 LAB — T4: T4, Total: 7.4 ug/dL (ref 4.5–12.0)

## 2024-06-15 ENCOUNTER — Other Ambulatory Visit: Payer: Self-pay | Admitting: Adult Health

## 2024-06-15 ENCOUNTER — Inpatient Hospital Stay

## 2024-06-15 VITALS — BP 134/89 | HR 78 | Temp 98.3°F | Resp 18

## 2024-06-15 DIAGNOSIS — Z170 Estrogen receptor positive status [ER+]: Secondary | ICD-10-CM

## 2024-06-15 DIAGNOSIS — Z5112 Encounter for antineoplastic immunotherapy: Secondary | ICD-10-CM | POA: Diagnosis not present

## 2024-06-15 MED ORDER — FILGRASTIM-SNDZ 480 MCG/0.8ML IJ SOSY
480.0000 ug | PREFILLED_SYRINGE | Freq: Once | INTRAMUSCULAR | Status: AC
  Administered 2024-06-15: 12:00:00 480 ug via SUBCUTANEOUS
  Filled 2024-06-15: qty 0.8

## 2024-06-16 ENCOUNTER — Inpatient Hospital Stay

## 2024-06-16 ENCOUNTER — Encounter: Payer: Self-pay | Admitting: Adult Health

## 2024-06-16 ENCOUNTER — Encounter: Payer: Self-pay | Admitting: *Deleted

## 2024-06-16 ENCOUNTER — Inpatient Hospital Stay (HOSPITAL_BASED_OUTPATIENT_CLINIC_OR_DEPARTMENT_OTHER): Admitting: Adult Health

## 2024-06-16 ENCOUNTER — Other Ambulatory Visit (HOSPITAL_COMMUNITY): Payer: Self-pay

## 2024-06-16 ENCOUNTER — Other Ambulatory Visit: Payer: Self-pay

## 2024-06-16 DIAGNOSIS — Z170 Estrogen receptor positive status [ER+]: Secondary | ICD-10-CM | POA: Diagnosis not present

## 2024-06-16 DIAGNOSIS — Z5112 Encounter for antineoplastic immunotherapy: Secondary | ICD-10-CM | POA: Diagnosis not present

## 2024-06-16 DIAGNOSIS — C50411 Malignant neoplasm of upper-outer quadrant of right female breast: Secondary | ICD-10-CM

## 2024-06-16 LAB — CBC WITH DIFFERENTIAL (CANCER CENTER ONLY)
Abs Immature Granulocytes: 0.48 K/uL — ABNORMAL HIGH (ref 0.00–0.07)
Basophils Absolute: 0.1 K/uL (ref 0.0–0.1)
Basophils Relative: 0 %
Eosinophils Absolute: 0 K/uL (ref 0.0–0.5)
Eosinophils Relative: 0 %
HCT: 27.2 % — ABNORMAL LOW (ref 36.0–46.0)
Hemoglobin: 9.6 g/dL — ABNORMAL LOW (ref 12.0–15.0)
Immature Granulocytes: 2 %
Lymphocytes Relative: 18 %
Lymphs Abs: 3.7 K/uL (ref 0.7–4.0)
MCH: 31.2 pg (ref 26.0–34.0)
MCHC: 35.3 g/dL (ref 30.0–36.0)
MCV: 88.3 fL (ref 80.0–100.0)
Monocytes Absolute: 0.4 K/uL (ref 0.1–1.0)
Monocytes Relative: 2 %
Neutro Abs: 16.1 K/uL — ABNORMAL HIGH (ref 1.7–7.7)
Neutrophils Relative %: 78 %
Platelet Count: 181 K/uL (ref 150–400)
RBC: 3.08 MIL/uL — ABNORMAL LOW (ref 3.87–5.11)
RDW: 15.9 % — ABNORMAL HIGH (ref 11.5–15.5)
WBC Count: 20.7 K/uL — ABNORMAL HIGH (ref 4.0–10.5)
nRBC: 0.1 % (ref 0.0–0.2)

## 2024-06-16 LAB — CMP (CANCER CENTER ONLY)
ALT: 36 U/L (ref 0–44)
AST: 22 U/L (ref 15–41)
Albumin: 4.2 g/dL (ref 3.5–5.0)
Alkaline Phosphatase: 64 U/L (ref 38–126)
Anion gap: 11 (ref 5–15)
BUN: 11 mg/dL (ref 6–20)
CO2: 24 mmol/L (ref 22–32)
Calcium: 9.2 mg/dL (ref 8.9–10.3)
Chloride: 104 mmol/L (ref 98–111)
Creatinine: 1.13 mg/dL — ABNORMAL HIGH (ref 0.44–1.00)
GFR, Estimated: 59 mL/min — ABNORMAL LOW
Glucose, Bld: 100 mg/dL — ABNORMAL HIGH (ref 70–99)
Potassium: 3.6 mmol/L (ref 3.5–5.1)
Sodium: 139 mmol/L (ref 135–145)
Total Bilirubin: 0.8 mg/dL (ref 0.0–1.2)
Total Protein: 6.9 g/dL (ref 6.5–8.1)

## 2024-06-16 MED ORDER — TRAMADOL HCL 50 MG PO TABS
50.0000 mg | ORAL_TABLET | Freq: Four times a day (QID) | ORAL | 0 refills | Status: AC | PRN
  Filled 2024-06-16: qty 25, 7d supply, fill #0

## 2024-06-16 MED ORDER — SODIUM CHLORIDE 0.9 % IV SOLN
150.0000 mg | Freq: Once | INTRAVENOUS | Status: AC
  Administered 2024-06-16: 150 mg via INTRAVENOUS
  Filled 2024-06-16: qty 15

## 2024-06-16 MED ORDER — DEXAMETHASONE SOD PHOSPHATE PF 10 MG/ML IJ SOLN
10.0000 mg | Freq: Once | INTRAMUSCULAR | Status: AC
  Administered 2024-06-16: 10 mg via INTRAVENOUS

## 2024-06-16 MED ORDER — DEXAMETHASONE 4 MG PO TABS
ORAL_TABLET | ORAL | 1 refills | Status: AC
  Filled 2024-06-16: qty 30, 15d supply, fill #0
  Filled 2024-06-26 – 2024-08-01 (×2): qty 30, 15d supply, fill #1

## 2024-06-16 MED ORDER — DIPHENHYDRAMINE HCL 50 MG/ML IJ SOLN
50.0000 mg | Freq: Once | INTRAMUSCULAR | Status: AC
  Administered 2024-06-16: 50 mg via INTRAVENOUS
  Filled 2024-06-16: qty 1

## 2024-06-16 MED ORDER — SODIUM CHLORIDE 0.9 % IV SOLN: INTRAVENOUS | Status: DC

## 2024-06-16 MED ORDER — SODIUM CHLORIDE 0.9 % IV SOLN
60.0000 mg/m2 | Freq: Once | INTRAVENOUS | Status: AC
  Administered 2024-06-16: 126 mg via INTRAVENOUS
  Filled 2024-06-16: qty 21

## 2024-06-16 MED ORDER — PALONOSETRON HCL INJECTION 0.25 MG/5ML
0.2500 mg | Freq: Once | INTRAVENOUS | Status: AC
  Administered 2024-06-16: 0.25 mg via INTRAVENOUS
  Filled 2024-06-16: qty 5

## 2024-06-16 MED ORDER — FAMOTIDINE IN NACL 20-0.9 MG/50ML-% IV SOLN
20.0000 mg | Freq: Once | INTRAVENOUS | Status: AC
  Administered 2024-06-16: 20 mg via INTRAVENOUS
  Filled 2024-06-16: qty 50

## 2024-06-16 NOTE — Patient Instructions (Signed)
 [facility] - A DEPT OF [facility]  Discharge Instructions: Thank you for choosing [facility] to provide your oncology and hematology care.   If you have a lab appointment with the Cancer Center, please go directly to the Cancer Center and check in at the registration area.   Wear comfortable clothing and clothing appropriate for easy access to any Portacath or PICC line.   We strive to give you quality time with your provider. You may need to reschedule your appointment if you arrive late (15 or more minutes).  Arriving late affects you and other patients whose appointments are after yours.  Also, if you miss three or more appointments without notifying the office, you may be dismissed from the clinic at the provider's discretion.      For prescription refill requests, have your pharmacy contact our office and allow 72 hours for refills to be completed.    Today you received the following chemotherapy and/or immunotherapy agents: Paclitaxel  (Taxol ) & Carboplatin  (Paraplatin )      To help prevent nausea and vomiting after your treatment, we encourage you to take your nausea medication as directed.  BELOW ARE SYMPTOMS THAT SHOULD BE REPORTED IMMEDIATELY: *FEVER GREATER THAN 100.4 F (38 C) OR HIGHER *CHILLS OR SWEATING *NAUSEA AND VOMITING THAT IS NOT CONTROLLED WITH YOUR NAUSEA MEDICATION *UNUSUAL SHORTNESS OF BREATH *UNUSUAL BRUISING OR BLEEDING *URINARY PROBLEMS (pain or burning when urinating, or frequent urination) *BOWEL PROBLEMS (unusual diarrhea, constipation, pain near the anus) TENDERNESS IN MOUTH AND THROAT WITH OR WITHOUT PRESENCE OF ULCERS (sore throat, sores in mouth, or a toothache) UNUSUAL RASH, SWELLING OR PAIN  UNUSUAL VAGINAL DISCHARGE OR ITCHING   Items with * indicate a potential emergency and should be followed up as soon as possible or go to the Emergency Department if any problems should occur.  Please show the  CHEMOTHERAPY ALERT CARD or IMMUNOTHERAPY ALERT CARD at check-in to the Emergency Department and triage nurse.  Should you have questions after your visit or need to cancel or reschedule your appointment, please contact [facility] - A DEPT OF [facility]  Dept: [phone]  and follow the prompts.  Office hours are 8:00 a.m. to 4:30 p.m. Monday - Friday. Please note that voicemails left after 4:00 p.m. may not be returned until the following business day.  We are closed weekends and major holidays. You have access to a nurse at all times for urgent questions. Please call the main number to the clinic Dept: [phone] and follow the prompts.   For any non-urgent questions, you may also contact your provider using MyChart. We now offer e-Visits for anyone 41 and older to request care online for non-urgent symptoms. For details visit [email or web address].   Also download the MyChart app! Go to the app store, search MyChart, open the app, select [place], and log in with your MyChart username and password.

## 2024-06-16 NOTE — Research (Signed)
 D7794, ICE COMPRESS: RANDOMIZED TRIAL OF LIMB CRYOCOMPRESSION VERSUS CONTINUOUS COMPRESSION VERSUS LOW CYCLIC COMPRESSION FOR THE PREVENTION OF TAXANE-INDUCED PERIPHERAL NEUROPATHY   Arm 2 Continuous Compression-Treatment 8 Patient arrives today unaccompanied for treatment 8. Confirmed patient does not have wounds, sores, or lesions to extremities. Patient has not had any vaccinations since last visit.  Patient states she wants to continue the Paxman device with today's treatment.      Adverse Events Solicited AEs reviewed with patient. She did not have any device related AEs.  She reports worsening numbness and tingling in her fingertips and toes which she describes as moderate. Although the sensation is stronger and more persistent, it still does not interfere with her ability to perform any personal or instrumental ADLs. 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 Patient denies any motor neuropathy symptoms.  Patient continues to report ongoing discoloration in the nail beds of right and left hands. See AE table below.   Treatment 7 (06/09/24-06/16/24) Adverse Event CTCAE Grade Onset date Resolved date Relationship to Study Intervention Relationship to Taxane Action Taken Comments  Skin atrophy (solicited) 0             Skin hyperpigmentation (solicited) 0             Skin hypopigmentation (solicited) 0             Skin induration (solicited) 0             Skin ulceration (solicited) 0             Rash maculopapular (solicited) 0             Nail changes (solicited) 0             Cold intolerance (solicited) (general disorders and administration site conditions- other) 0             Frostbite (solicited) (skin and subcutaneous tissue disorders- other) 0             Neuropathy Sensory 1 05/27/24 06/16/24 Unrelated Definite none  Pt reports started after treatment 5  Neuropathy Sensory 2 06/16/24 ongoing Unrelated Definite Taxol  dose decreased Worsened since  treatment 7  Nail Discoloration 1 05/19/24 ongoing Unrelated Definite none Pt reports started after treatment 3      STUDY INTERVENTION & TOLERABILITY ASSESSMENT 1:22 pm- Device wraps applied; pre-treatment started set on Arm 2 Continuous Compression, pressure setting 25 mmHG. 1:28 pm- Required 5-15 minute Tolerability check- pt states device is tolerable to compression and wraps.  1:54 pm- Taxane infusion start time. Device display moved to treatment phase.  3:07 pm- Taxane infusion end time. Device display moved to post treatment phase. 3:10 pm- Wraps removed and device paused for bathroom break x 8 minutes. Re-wrapped and restarted at 3:18 pm. 3:48 pm  Device stopped and wraps removed. Confirmed skin is intact and no wounds or sores.   PLAN:  Patient was thanked for their ongoing participation in this study. Informed patient this research nurse will meet her at her next infusion appointment on 06/23/24 to use the Paxman device again. She verbalized understanding and agreement to this plan. Patient has been provided direct contact information and is encouraged to contact this Nurse for any needs or questions.  Cherylyn Hoard, BSN, RN, [employer] Research Nurse II [phone] 06/16/2024

## 2024-06-16 NOTE — Progress Notes (Unsigned)
 [facility] Cancer Follow up:    Angela Marshall SAILOR, FNP [name] [place] [address]   DIAGNOSIS: Cancer Staging  Malignant neoplasm of upper-outer quadrant of right breast in female, estrogen receptor positive (HCC) Staging form: Breast, AJCC 8th Edition - Clinical: Stage IIB (cT2, cN0, cM0, G3, ER-, PR-, HER2-) - Signed by Odean Potts, MD on 04/05/2024 Stage prefix: Initial diagnosis Histologic grading system: 3 grade system    SUMMARY OF ONCOLOGIC HISTORY: Oncology History  Malignant neoplasm of upper-outer quadrant of right breast in female, estrogen receptor positive (HCC)  03/30/2024 Initial Diagnosis   Screening mammogram detected right upper outer quadrant mass 2.6 cm by mammogram, by ultrasound it measured 2.2 cm at 10 o'clock position, axilla negative.  Left breast: Cysts: Aspirated Biopsy: Grade 3 IDC ER 2%, PR 0%, Ki67 60%, HER2 negative   04/05/2024 Cancer Staging   Staging form: Breast, AJCC 8th Edition - Clinical: Stage IIB (cT2, cN0, cM0, G3, ER-, PR-, HER2-) - Signed by Odean Potts, MD on 04/05/2024 Stage prefix: Initial diagnosis Histologic grading system: 3 grade system   04/15/2024 Genetic Testing   Negative genetic testing. Report date: 04/15/2024  The [facility] CancerNext+RNAinsight Panel includes sequencing, rearrangement analysis, and RNA analysis for the following 40 genes: APC, ATM, BAP1, BARD1, BMPR1A, BRCA1, BRCA2, BRIP1, CDH1, CDKN2A, CHEK2, FH, FLCN, MET, MLH1, MSH2, MSH6, MUTYH, NF1, NTHL1, PALB2, PMS2, PTEN, RAD51C, RAD51D, RPS20, SMAD4, STK11, TP53, TSC1, TSC2 and VHL (sequencing and deletion/duplication); AXIN2, HOXB13, MBD4, MSH3, POLD1 and POLE (sequencing only); EPCAM and GREM1 (deletion/duplication only). RNA data is routinely analyzed for use in variant interpretation for all genes.    04/21/2024 -  Chemotherapy   Patient is on Treatment Plan : BREAST Pembrolizumab  (200) D1 + Carboplatin  (1.5) D1,8,15 +  Paclitaxel  (80) D1,8,15 q21d X 4 cycles / Pembrolizumab  (200) D1 + AC D1 q21d x 4 cycles      Genetic Testing   Negative genetic testing. Report date: 04/15/2024  The [facility] CancerNext+RNAinsight Panel includes sequencing, rearrangement analysis, and RNA analysis for the following 40 genes: APC, ATM, BAP1, BARD1, BMPR1A, BRCA1, BRCA2, BRIP1, CDH1, CDKN2A, CHEK2, FH, FLCN, MET, MLH1, MSH2, MSH6, MUTYH, NF1, NTHL1, PALB2, PMS2, PTEN, RAD51C, RAD51D, RPS20, SMAD4, STK11, TP53, TSC1, TSC2 and VHL (sequencing and deletion/duplication); AXIN2, HOXB13, MBD4, MSH3, POLD1 and POLE (sequencing only); EPCAM and GREM1 (deletion/duplication only). RNA data is routinely analyzed for use in variant interpretation for all genes.      CURRENT THERAPY:  INTERVAL HISTORY:  Discussed the use of AI scribe software for clinical note transcription with the patient, who gave verbal consent to proceed.  History of Present Illness      Patient Active Problem List   Diagnosis Date Noted   Genetic testing 04/26/2024   Port-A-Cath in place 04/21/2024   Family history of brain cancer    Family history of breast cancer    Family history of ovarian cancer    Breast cancer (HCC)    Malignant neoplasm of upper-outer quadrant of right breast in female, estrogen receptor positive (HCC) 04/03/2024    is allergic to penicillins.  MEDICAL HISTORY: Past Medical History:  Diagnosis Date   Anxiety    Breast cancer (HCC)    hx right breast ca in 02/2024   Family history of brain cancer    Father at 55   Family history of breast cancer    maternal grandmother   Family history of ovarian cancer    Paternal  1st Cousin at 57   HLD (hyperlipidemia)    Hypertension    Seasonal allergies     SURGICAL HISTORY: Past Surgical History:  Procedure Laterality Date   ABDOMINOPLASTY     HERNIA REPAIR     as a child 57 -75yrs old   PORTACATH PLACEMENT N/A 04/12/2024   Procedure: INSERTION, TUNNELED  CENTRAL VENOUS DEVICE, WITH PORT;  Surgeon: Vernetta Berg, MD;  Location: [facility] OR;  Service: General;  Laterality: N/A;  PORT PLACEMENT WITH ULTRASOUND GUIDANCE   WISDOM TOOTH EXTRACTION      SOCIAL HISTORY: Social History   Socioeconomic History   Marital status: Legally Separated    Spouse name: Not on file   Number of children: Not on file   Years of education: Not on file   Highest education level: Not on file  Occupational History   Not on file  Tobacco Use   Smoking status: Former    Current packs/day: 0.25    Types: Cigarettes   Smokeless tobacco: Never   Tobacco comments:    Quit smoking 09/2023  Vaping Use   Vaping status: Former  Substance and Sexual Activity   Alcohol use: Yes    Alcohol/week: 1.0 - 2.0 standard drink of alcohol    Types: 1 - 2 Standard drinks or equivalent per week    Comment: wine/liquor   Drug use: Not on file    Comment: CBD Gummies   Sexual activity: Yes    Birth control/protection: Post-menopausal  Other Topics Concern   Not on file  Social History Narrative   Not on file   Social Drivers of Health   Tobacco Use: Medium Risk (06/16/2024)   Patient History    Smoking Tobacco Use: Former    Smokeless Tobacco Use: Never    Passive Exposure: Not on Actuary Strain: Not on file  Food Insecurity: No Food Insecurity (06/16/2024)   Epic    Worried About Programme Researcher, Broadcasting/film/video in the Last Year: Never true    Ran Out of Food in the Last Year: Never true  Transportation Needs: No Transportation Needs (06/16/2024)   Epic    Lack of Transportation (Medical): No    Lack of Transportation (Non-Medical): No  Physical Activity: Not on file  Stress: Not on file  Social Connections: Moderately Isolated (06/16/2024)   Social Connection and Isolation Panel    Frequency of Communication with Friends and Family: More than three times a week    Frequency of Social Gatherings with Friends and Family: Twice a  week    Attends Religious Services: More than 4 times per year    Active Member of Clubs or Organizations: No    Attends Banker Meetings: Never    Marital Status: Never married  Intimate Partner Violence: Not At Risk (06/16/2024)   Epic    Fear of Current or Ex-Partner: No    Emotionally Abused: No    Physically Abused: No    Sexually Abused: No  Depression (PHQ2-9): Low Risk (06/09/2024)   Depression (PHQ2-9)    PHQ-2 Score: 0  Alcohol Screen: Low Risk (06/16/2024)   Alcohol Screen    Last Alcohol Screening Score (AUDIT): 0  Housing: Unknown (06/16/2024)   Epic    Unable to Pay for Housing in the Last Year: No    Number of Times Moved in the Last Year: Not on file    Homeless in the Last Year: No  Utilities: Not At Risk (06/16/2024)  Epic    Threatened with loss of utilities: No  Recent Concern: Utilities - At Risk (04/21/2024)   Epic    Threatened with loss of utilities: Yes  Health Literacy: Adequate Health Literacy (06/16/2024)   B1300 Health Literacy    Frequency of need for help with medical instructions: Never    FAMILY HISTORY: Family History  Problem Relation Age of Onset   Brain cancer Father 69   Breast cancer Maternal Grandmother 30   Cancer Paternal Aunt 59 - 65       Unknown Type   Ovarian cancer Paternal Cousin 25   Cancer Paternal Uncle        Unknown type    Review of Systems - Oncology    PHYSICAL EXAMINATION    Vitals:   06/16/24 1144  BP: 135/71  Pulse: 93  Resp: 16  Temp: 97.8 F (36.6 C)  SpO2: 98%    Physical Exam  LABORATORY DATA:  CBC    Component Value Date/Time   WBC 20.7 (H) 06/16/2024 1118   WBC 8.8 04/25/2024 0056   RBC 3.08 (L) 06/16/2024 1118   HGB 9.6 (L) 06/16/2024 1118   HCT 27.2 (L) 06/16/2024 1118   PLT 181 06/16/2024 1118   MCV 88.3 06/16/2024 1118   MCH 31.2 06/16/2024 1118   MCHC 35.3 06/16/2024 1118   RDW 15.9 (H) 06/16/2024 1118   LYMPHSABS 3.7 06/16/2024 1118    MONOABS 0.4 06/16/2024 1118   EOSABS 0.0 06/16/2024 1118   BASOSABS 0.1 06/16/2024 1118    CMP     Component Value Date/Time   NA 139 06/16/2024 1118   K 3.6 06/16/2024 1118   CL 104 06/16/2024 1118   CO2 24 06/16/2024 1118   GLUCOSE 100 (H) 06/16/2024 1118   BUN 11 06/16/2024 1118   CREATININE 1.13 (H) 06/16/2024 1118   CALCIUM 9.2 06/16/2024 1118   PROT 6.9 06/16/2024 1118   ALBUMIN 4.2 06/16/2024 1118   AST 22 06/16/2024 1118   ALT 36 06/16/2024 1118   ALKPHOS 64 06/16/2024 1118   BILITOT 0.8 06/16/2024 1118   GFRNONAA 59 (L) 06/16/2024 1118     ASSESSMENT and THERAPY PLAN:   No problem-specific Assessment & Plan notes found for this encounter.   Assessment and Plan Assessment & Plan       All questions were answered. The patient knows to call the clinic with any problems, questions or concerns. We can certainly see the patient much sooner if necessary.  Total encounter time:*** minutes*in face-to-face visit time, chart review, lab review, care coordination, order entry, and documentation of the encounter time.    Morna Kendall, NP 06/16/2024 12:09 PM Medical Oncology and Hematology [facility] [address] [place], [place] [address] Tel. [phone]    Fax. [phone]  *Total Encounter Time as defined by the Centers for Medicare and Medicaid Services includes, in addition to the face-to-face time of a patient visit (documented in the note above) non-face-to-face time: obtaining and reviewing outside history, ordering and reviewing medications, tests or procedures, care coordination (communications with other health care professionals or caregivers) and documentation in the medical record.

## 2024-06-19 ENCOUNTER — Other Ambulatory Visit: Payer: Self-pay | Admitting: Hematology and Oncology

## 2024-06-20 ENCOUNTER — Encounter: Payer: Self-pay | Admitting: Hematology and Oncology

## 2024-06-21 ENCOUNTER — Inpatient Hospital Stay

## 2024-06-21 ENCOUNTER — Encounter: Payer: Self-pay | Admitting: Hematology and Oncology

## 2024-06-21 VITALS — BP 123/77 | HR 88 | Temp 98.9°F | Resp 16

## 2024-06-21 DIAGNOSIS — Z5112 Encounter for antineoplastic immunotherapy: Secondary | ICD-10-CM | POA: Diagnosis not present

## 2024-06-21 DIAGNOSIS — C50411 Malignant neoplasm of upper-outer quadrant of right female breast: Secondary | ICD-10-CM

## 2024-06-21 MED ORDER — FILGRASTIM-SNDZ 480 MCG/0.8ML IJ SOSY
480.0000 ug | PREFILLED_SYRINGE | Freq: Once | INTRAMUSCULAR | Status: AC
  Administered 2024-06-21: 480 ug via SUBCUTANEOUS
  Filled 2024-06-21: qty 0.8

## 2024-06-22 ENCOUNTER — Other Ambulatory Visit: Payer: Self-pay

## 2024-06-23 ENCOUNTER — Inpatient Hospital Stay

## 2024-06-23 ENCOUNTER — Encounter: Payer: Self-pay | Admitting: *Deleted

## 2024-06-23 DIAGNOSIS — Z5112 Encounter for antineoplastic immunotherapy: Secondary | ICD-10-CM | POA: Diagnosis not present

## 2024-06-23 DIAGNOSIS — Z170 Estrogen receptor positive status [ER+]: Secondary | ICD-10-CM

## 2024-06-23 LAB — CMP (CANCER CENTER ONLY)
ALT: 39 U/L (ref 0–44)
AST: 28 U/L (ref 15–41)
Albumin: 4.1 g/dL (ref 3.5–5.0)
Alkaline Phosphatase: 56 U/L (ref 38–126)
Anion gap: 13 (ref 5–15)
BUN: 14 mg/dL (ref 6–20)
CO2: 24 mmol/L (ref 22–32)
Calcium: 9.2 mg/dL (ref 8.9–10.3)
Chloride: 102 mmol/L (ref 98–111)
Creatinine: 1.19 mg/dL — ABNORMAL HIGH (ref 0.44–1.00)
GFR, Estimated: 55 mL/min — ABNORMAL LOW
Glucose, Bld: 134 mg/dL — ABNORMAL HIGH (ref 70–99)
Potassium: 3.3 mmol/L — ABNORMAL LOW (ref 3.5–5.1)
Sodium: 139 mmol/L (ref 135–145)
Total Bilirubin: 0.5 mg/dL (ref 0.0–1.2)
Total Protein: 6.8 g/dL (ref 6.5–8.1)

## 2024-06-23 LAB — CBC WITH DIFFERENTIAL (CANCER CENTER ONLY)
Abs Immature Granulocytes: 0.09 K/uL — ABNORMAL HIGH (ref 0.00–0.07)
Basophils Absolute: 0 K/uL (ref 0.0–0.1)
Basophils Relative: 0 %
Eosinophils Absolute: 0 K/uL (ref 0.0–0.5)
Eosinophils Relative: 1 %
HCT: 27 % — ABNORMAL LOW (ref 36.0–46.0)
Hemoglobin: 9.2 g/dL — ABNORMAL LOW (ref 12.0–15.0)
Immature Granulocytes: 3 %
Lymphocytes Relative: 88 %
Lymphs Abs: 2.5 K/uL (ref 0.7–4.0)
MCH: 30.4 pg (ref 26.0–34.0)
MCHC: 34.1 g/dL (ref 30.0–36.0)
MCV: 89.1 fL (ref 80.0–100.0)
Monocytes Absolute: 0.2 K/uL (ref 0.1–1.0)
Monocytes Relative: 5 %
Neutro Abs: 0.1 K/uL — CL (ref 1.7–7.7)
Neutrophils Relative %: 3 %
Platelet Count: 229 K/uL (ref 150–400)
RBC: 3.03 MIL/uL — ABNORMAL LOW (ref 3.87–5.11)
RDW: 16.9 % — ABNORMAL HIGH (ref 11.5–15.5)
Smear Review: NORMAL
WBC Count: 2.9 K/uL — ABNORMAL LOW (ref 4.0–10.5)
nRBC: 2.1 % — ABNORMAL HIGH (ref 0.0–0.2)

## 2024-06-23 NOTE — Research (Signed)
 D7794, ICE COMPRESS: RANDOMIZED TRIAL OF LIMB CRYOCOMPRESSION VERSUS CONTINUOUS COMPRESSION VERSUS LOW CYCLIC COMPRESSION FOR THE PREVENTION OF TAXANE-INDUCED PERIPHERAL NEUROPATHY   Arm 2 Continuous Compression-Treatment 9- (Canceled due to low ANC) Patient arrives today unaccompanied for treatment 9. Confirmed patient does not have wounds, sores, or lesions to extremities. Patient has not had any vaccinations since last visit.  Patient states she wants to continue the Paxman device with today's treatment.      Adverse Events Solicited AEs reviewed with patient. She did not have any device related AEs.  She reports ongoing numbness and tingling in her fingertips and toes which she says is not any worse than last week and possibly a little better. She has been taking tramadol  prn at bedtime since it bothers her more at night. Dr.  Patient denies any motor neuropathy symptoms.  Patient continues to report ongoing discoloration in the nail beds of right and left hands. She also started having some darkening of the skin on the dorsum of bilateral hands below the knuckles. See AE table below.   Treatment  (06/16/24-06/23/24) Adverse Event CTCAE Grade Onset date Resolved date Relationship to Study Intervention Relationship to Taxane Action Taken Comments  Skin atrophy (solicited) 0             Skin hyperpigmentation (solicited) 1  06/21/24  ongoing  Unrelated   none    Skin hypopigmentation (solicited) 0             Skin induration (solicited) 0             Skin ulceration (solicited) 0             Rash maculopapular (solicited) 0             Nail changes (solicited) 0             Cold intolerance (solicited) (general disorders and administration site conditions- other) 0             Frostbite (solicited) (skin and subcutaneous tissue disorders- other) 0             Neuropathy Sensory 1 05/27/24 06/16/24 Unrelated Definite none  Pt reports started after treatment 5  Neuropathy Sensory 2 06/16/24  ongoing Unrelated Definite Taxol  dose decreased prior to tx 8 Worsened since treatment 7  Nail Discoloration 1 05/19/24 ongoing Unrelated Definite none Pt reports started after treatment 3     PLAN:  Treatment today was canceled today due to low ANC.  Patient's next treatment is scheduled for 07/03/24. Research nurse will meet patient at her next infusion appointment to use the Paxman device again. She verbalized understanding and agreement to this plan. Patient has been provided direct contact information and is encouraged to contact this Nurse for any needs or questions.  Cherylyn Hoard, BSN, RN, [employer] Research Nurse II [phone] 06/23/2024

## 2024-06-23 NOTE — Progress Notes (Signed)
 CRITICAL VALUE STICKER  CRITICAL VALUE: ANC: 0.1   RECEIVER (on-site recipient of call): Rocky Haggard, RN   DATE & TIME NOTIFIED: 06/23/24 @ [phone]  MESSENGER (representative from lab): Powell Beat  MD NOTIFIED: Dr. Autumn  TIME OF NOTIFICATION: 06/23/24 @ 0835  RESPONSE: Hold treatment today. Patient schedule to receive series of GCSF next week. Strict neutropenic precautions reviewed with patient.

## 2024-06-23 NOTE — Patient Instructions (Addendum)
 [facility] - A DEPT OF [facility]  Discharge Instructions: Thank you for choosing [facility] to provide your oncology and hematology care.   If you have a lab appointment with the Cancer Center, please go directly to the Cancer Center and check in at the registration area.   Wear comfortable clothing and clothing appropriate for easy access to any Portacath or PICC line.   We strive to give you quality time with your provider. You may need to reschedule your appointment if you arrive late (15 or more minutes).  Arriving late affects you and other patients whose appointments are after yours.  Also, if you miss three or more appointments without notifying the office, you may be dismissed from the clinic at the provider's discretion.      For prescription refill requests, have your pharmacy contact our office and allow 72 hours for refills to be completed.     To help prevent nausea and vomiting after your treatment, we encourage you to take your nausea medication as directed.  BELOW ARE SYMPTOMS THAT SHOULD BE REPORTED IMMEDIATELY: *FEVER GREATER THAN 100.4 F (38 C) OR HIGHER *CHILLS OR SWEATING *NAUSEA AND VOMITING THAT IS NOT CONTROLLED WITH YOUR NAUSEA MEDICATION *UNUSUAL SHORTNESS OF BREATH *UNUSUAL BRUISING OR BLEEDING *URINARY PROBLEMS (pain or burning when urinating, or frequent urination) *BOWEL PROBLEMS (unusual diarrhea, constipation, pain near the anus) TENDERNESS IN MOUTH AND THROAT WITH OR WITHOUT PRESENCE OF ULCERS (sore throat, sores in mouth, or a toothache) UNUSUAL RASH, SWELLING OR PAIN  UNUSUAL VAGINAL DISCHARGE OR ITCHING   Items with * indicate a potential emergency and should be followed up as soon as possible or go to the Emergency Department if any problems should occur.  Please show the CHEMOTHERAPY ALERT CARD or IMMUNOTHERAPY ALERT CARD at check-in to the Emergency Department and triage nurse.  Should you have  questions after your visit or need to cancel or reschedule your appointment, please contact [facility] - A DEPT OF [facility]  Dept: [phone]  and follow the prompts.  Office hours are 8:00 a.m. to 4:30 p.m. Monday - Friday. Please note that voicemails left after 4:00 p.m. may not be returned until the following business day.  We are closed weekends and major holidays. You have access to a nurse at all times for urgent questions. Please call the main number to the clinic Dept: [phone] and follow the prompts.   For any non-urgent questions, you may also contact your provider using MyChart. We now offer e-Visits for anyone 33 and older to request care online for non-urgent symptoms. For details visit [email or web address].   Also download the MyChart app! Go to the app store, search MyChart, open the app, select [facility], and log in with your MyChart username and password.

## 2024-06-26 ENCOUNTER — Inpatient Hospital Stay

## 2024-06-26 ENCOUNTER — Encounter (HOSPITAL_COMMUNITY): Payer: Self-pay

## 2024-06-26 ENCOUNTER — Other Ambulatory Visit (HOSPITAL_COMMUNITY): Payer: Self-pay

## 2024-06-26 ENCOUNTER — Other Ambulatory Visit: Payer: Self-pay | Admitting: Adult Health

## 2024-06-26 ENCOUNTER — Other Ambulatory Visit: Payer: Self-pay

## 2024-06-26 VITALS — BP 136/85 | HR 88 | Temp 98.2°F | Resp 16

## 2024-06-26 DIAGNOSIS — Z5112 Encounter for antineoplastic immunotherapy: Secondary | ICD-10-CM | POA: Diagnosis not present

## 2024-06-26 DIAGNOSIS — Z170 Estrogen receptor positive status [ER+]: Secondary | ICD-10-CM

## 2024-06-26 MED ORDER — FILGRASTIM-SNDZ 480 MCG/0.8ML IJ SOSY
480.0000 ug | PREFILLED_SYRINGE | Freq: Once | INTRAMUSCULAR | Status: AC
  Administered 2024-06-26: 480 ug via SUBCUTANEOUS
  Filled 2024-06-26: qty 0.8

## 2024-06-26 MED ORDER — OMEPRAZOLE 40 MG PO CPDR
40.0000 mg | DELAYED_RELEASE_CAPSULE | Freq: Every day | ORAL | 1 refills | Status: AC
  Filled 2024-06-26: qty 30, 30d supply, fill #0
  Filled 2024-08-01: qty 30, 30d supply, fill #1

## 2024-06-26 MED ORDER — POTASSIUM CHLORIDE CRYS ER 10 MEQ PO TBCR: 10.0000 meq | EXTENDED_RELEASE_TABLET | Freq: Every day | ORAL | 0 refills | Status: AC

## 2024-06-26 NOTE — Telephone Encounter (Signed)
 Refill request completed. Quantity adjusted to 30 tablets per provider instruction (previous quantity was 7).

## 2024-06-27 ENCOUNTER — Other Ambulatory Visit: Payer: Self-pay

## 2024-06-27 ENCOUNTER — Inpatient Hospital Stay

## 2024-06-27 ENCOUNTER — Other Ambulatory Visit (HOSPITAL_COMMUNITY): Payer: Self-pay

## 2024-06-27 VITALS — BP 143/77 | HR 100 | Resp 16

## 2024-06-27 DIAGNOSIS — Z5112 Encounter for antineoplastic immunotherapy: Secondary | ICD-10-CM | POA: Diagnosis not present

## 2024-06-27 DIAGNOSIS — C50411 Malignant neoplasm of upper-outer quadrant of right female breast: Secondary | ICD-10-CM

## 2024-06-27 MED ORDER — FILGRASTIM-SNDZ 480 MCG/0.8ML IJ SOSY
480.0000 ug | PREFILLED_SYRINGE | Freq: Once | INTRAMUSCULAR | Status: AC
  Administered 2024-06-27: 480 ug via SUBCUTANEOUS
  Filled 2024-06-27: qty 0.8

## 2024-06-28 ENCOUNTER — Inpatient Hospital Stay

## 2024-06-28 ENCOUNTER — Other Ambulatory Visit: Payer: Self-pay

## 2024-06-28 VITALS — BP 144/93 | HR 105 | Temp 98.9°F | Resp 17

## 2024-06-28 DIAGNOSIS — Z170 Estrogen receptor positive status [ER+]: Secondary | ICD-10-CM

## 2024-06-28 DIAGNOSIS — Z5112 Encounter for antineoplastic immunotherapy: Secondary | ICD-10-CM | POA: Diagnosis not present

## 2024-06-28 MED ORDER — FILGRASTIM-SNDZ 480 MCG/0.8ML IJ SOSY
480.0000 ug | PREFILLED_SYRINGE | Freq: Once | INTRAMUSCULAR | Status: AC
  Administered 2024-06-28: 480 ug via SUBCUTANEOUS
  Filled 2024-06-28: qty 0.8

## 2024-06-29 ENCOUNTER — Encounter: Payer: Self-pay | Admitting: Hematology and Oncology

## 2024-07-03 ENCOUNTER — Inpatient Hospital Stay (HOSPITAL_BASED_OUTPATIENT_CLINIC_OR_DEPARTMENT_OTHER): Admitting: Hematology and Oncology

## 2024-07-03 ENCOUNTER — Inpatient Hospital Stay: Attending: Hematology and Oncology

## 2024-07-03 ENCOUNTER — Inpatient Hospital Stay: Admitting: Hematology and Oncology

## 2024-07-03 ENCOUNTER — Inpatient Hospital Stay

## 2024-07-03 VITALS — BP 131/90 | HR 80 | Temp 98.2°F | Resp 17 | Ht 64.0 in | Wt 221.4 lb

## 2024-07-03 DIAGNOSIS — C50411 Malignant neoplasm of upper-outer quadrant of right female breast: Secondary | ICD-10-CM

## 2024-07-03 DIAGNOSIS — Z5189 Encounter for other specified aftercare: Secondary | ICD-10-CM | POA: Diagnosis not present

## 2024-07-03 DIAGNOSIS — R432 Parageusia: Secondary | ICD-10-CM | POA: Diagnosis not present

## 2024-07-03 DIAGNOSIS — Z171 Estrogen receptor negative status [ER-]: Secondary | ICD-10-CM | POA: Insufficient documentation

## 2024-07-03 DIAGNOSIS — G478 Other sleep disorders: Secondary | ICD-10-CM | POA: Insufficient documentation

## 2024-07-03 DIAGNOSIS — G620 Drug-induced polyneuropathy: Secondary | ICD-10-CM | POA: Insufficient documentation

## 2024-07-03 DIAGNOSIS — D709 Neutropenia, unspecified: Secondary | ICD-10-CM | POA: Diagnosis not present

## 2024-07-03 DIAGNOSIS — Z79899 Other long term (current) drug therapy: Secondary | ICD-10-CM | POA: Insufficient documentation

## 2024-07-03 DIAGNOSIS — Z5111 Encounter for antineoplastic chemotherapy: Secondary | ICD-10-CM | POA: Diagnosis present

## 2024-07-03 DIAGNOSIS — R110 Nausea: Secondary | ICD-10-CM | POA: Diagnosis not present

## 2024-07-03 DIAGNOSIS — Z880 Allergy status to penicillin: Secondary | ICD-10-CM | POA: Diagnosis not present

## 2024-07-03 DIAGNOSIS — R12 Heartburn: Secondary | ICD-10-CM | POA: Insufficient documentation

## 2024-07-03 DIAGNOSIS — Z1732 Human epidermal growth factor receptor 2 negative status: Secondary | ICD-10-CM | POA: Diagnosis not present

## 2024-07-03 DIAGNOSIS — Z1722 Progesterone receptor negative status: Secondary | ICD-10-CM | POA: Diagnosis not present

## 2024-07-03 DIAGNOSIS — R197 Diarrhea, unspecified: Secondary | ICD-10-CM | POA: Diagnosis not present

## 2024-07-03 DIAGNOSIS — N289 Disorder of kidney and ureter, unspecified: Secondary | ICD-10-CM | POA: Diagnosis not present

## 2024-07-03 DIAGNOSIS — Z5112 Encounter for antineoplastic immunotherapy: Secondary | ICD-10-CM | POA: Diagnosis present

## 2024-07-03 DIAGNOSIS — Z170 Estrogen receptor positive status [ER+]: Secondary | ICD-10-CM | POA: Diagnosis not present

## 2024-07-03 DIAGNOSIS — R42 Dizziness and giddiness: Secondary | ICD-10-CM | POA: Insufficient documentation

## 2024-07-03 DIAGNOSIS — T451X5A Adverse effect of antineoplastic and immunosuppressive drugs, initial encounter: Secondary | ICD-10-CM | POA: Diagnosis not present

## 2024-07-03 DIAGNOSIS — M898X9 Other specified disorders of bone, unspecified site: Secondary | ICD-10-CM | POA: Insufficient documentation

## 2024-07-03 DIAGNOSIS — N6002 Solitary cyst of left breast: Secondary | ICD-10-CM | POA: Diagnosis not present

## 2024-07-03 LAB — CBC WITH DIFFERENTIAL (CANCER CENTER ONLY)
Abs Immature Granulocytes: 0.07 K/uL (ref 0.00–0.07)
Basophils Absolute: 0 K/uL (ref 0.0–0.1)
Basophils Relative: 1 %
Eosinophils Absolute: 0 K/uL (ref 0.0–0.5)
Eosinophils Relative: 1 %
HCT: 30 % — ABNORMAL LOW (ref 36.0–46.0)
Hemoglobin: 10.2 g/dL — ABNORMAL LOW (ref 12.0–15.0)
Immature Granulocytes: 1 %
Lymphocytes Relative: 51 %
Lymphs Abs: 3.7 K/uL (ref 0.7–4.0)
MCH: 30.6 pg (ref 26.0–34.0)
MCHC: 34 g/dL (ref 30.0–36.0)
MCV: 90.1 fL (ref 80.0–100.0)
Monocytes Absolute: 0.7 K/uL (ref 0.1–1.0)
Monocytes Relative: 10 %
Neutro Abs: 2.5 K/uL (ref 1.7–7.7)
Neutrophils Relative %: 36 %
Platelet Count: 168 K/uL (ref 150–400)
RBC: 3.33 MIL/uL — ABNORMAL LOW (ref 3.87–5.11)
RDW: 17.9 % — ABNORMAL HIGH (ref 11.5–15.5)
WBC Count: 7 K/uL (ref 4.0–10.5)
nRBC: 1.1 % — ABNORMAL HIGH (ref 0.0–0.2)

## 2024-07-03 LAB — CMP (CANCER CENTER ONLY)
ALT: 93 U/L — ABNORMAL HIGH (ref 0–44)
AST: 64 U/L — ABNORMAL HIGH (ref 15–41)
Albumin: 4.3 g/dL (ref 3.5–5.0)
Alkaline Phosphatase: 90 U/L (ref 38–126)
Anion gap: 12 (ref 5–15)
BUN: 15 mg/dL (ref 6–20)
CO2: 24 mmol/L (ref 22–32)
Calcium: 9.6 mg/dL (ref 8.9–10.3)
Chloride: 106 mmol/L (ref 98–111)
Creatinine: 1.2 mg/dL — ABNORMAL HIGH (ref 0.44–1.00)
GFR, Estimated: 55 mL/min — ABNORMAL LOW
Glucose, Bld: 120 mg/dL — ABNORMAL HIGH (ref 70–99)
Potassium: 3.5 mmol/L (ref 3.5–5.1)
Sodium: 142 mmol/L (ref 135–145)
Total Bilirubin: 0.5 mg/dL (ref 0.0–1.2)
Total Protein: 7.6 g/dL (ref 6.5–8.1)

## 2024-07-03 MED ORDER — ALTEPLASE 2 MG IJ SOLR
2.0000 mg | Freq: Once | INTRAMUSCULAR | Status: AC | PRN
  Administered 2024-07-03: 2 mg
  Filled 2024-07-03: qty 2

## 2024-07-03 NOTE — Research (Signed)
 D7794, ICE COMPRESS: RANDOMIZED TRIAL OF LIMB CRYOCOMPRESSION VERSUS CONTINUOUS COMPRESSION VERSUS LOW CYCLIC COMPRESSION FOR THE PREVENTION OF TAXANE-INDUCED PERIPHERAL NEUROPATHY   Arm 2 Continuous Compression-Treatment #9 visit Patient arrives today unaccompanied for treatment 9. This treatment was previously scheduled for 06/23/24 and canceled due to low ANC. Confirmed patient does not have wounds, sores, or lesions to extremities. Patient has not had any vaccinations since last visit.  Patient states she wants to continue the Paxman device with today's treatment.      Adverse Events Solicited AEs reviewed with patient. She did not have any device related AEs.  She reports ongoing numbness and tingling in her fingertips and toes which she says is worse since last assessment on 12/26. She has dropped a few things and is having problems feeling her hair and hair ties to fix her hair. Patient continues to take tramadol  at bedtime since it bothers her more at night. Patient denies any motor neuropathy symptoms.  Patient continues to report ongoing discoloration in the nail beds of right and left hands. She also has some darkening of the skin on the dorsum of bilateral hands below the knuckles. See AE table below.  Patient seen by Dr. Gudena today prior to treatment and he is aware of the above AEs. He states neuropathy, hyperpigmentation and nail discoloration are all related to Taxol .    Treatment 8 (06/16/24-07/03/24) Adverse Event CTCAE Grade Onset date Resolved date Relationship to Study Intervention Relationship to Taxane Action Taken Comments  Skin atrophy (solicited) 0             Skin hyperpigmentation (solicited) 1  06/21/24  ongoing  Unrelated Definite  none    Skin hypopigmentation (solicited) 0             Skin induration (solicited) 0             Skin ulceration (solicited) 0             Rash maculopapular (solicited) 0             Nail changes (solicited) 0             Cold intolerance  (solicited) (general disorders and administration site conditions- other) 0             Frostbite (solicited) (skin and subcutaneous tissue disorders- other) 0             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 Gudena discontinued Taxol  today due to neuropathy.  Patient will proceed to [facility] part of treatment with Pembrolizumab . Paxman device will no longer be used. Patient agreed to continue on study with ongoing assessments as required. Next assessment for 12 weeks on study will be completed at next visit 07/24/24. She verbalized understanding and agreement to this plan. Patient has been provided direct contact information and is encouraged to contact this Nurse for any needs or questions.  Cherylyn Hoard, BSN, RN, [employer] Research Nurse II [phone] 07/03/2024

## 2024-07-03 NOTE — Progress Notes (Signed)
 "  Patient Care Team: Lenon Nell SAILOR, FNP as PCP - General (Nurse Practitioner) Gerome, Devere HERO, RN as Oncology Nurse Navigator Tyree Nanetta SAILOR, RN as Oncology Nurse Navigator Vernetta Berg, MD as Consulting Physician (General Surgery) Odean Potts, MD as Consulting Physician (Hematology and Oncology) Dewey Rush, MD as Consulting Physician (Radiation Oncology)  DIAGNOSIS:  Encounter Diagnosis  Name Primary?   Malignant neoplasm of upper-outer quadrant of right breast in female, estrogen receptor positive (HCC) Yes    SUMMARY OF ONCOLOGIC HISTORY: Oncology History  Malignant neoplasm of upper-outer quadrant of right breast in female, estrogen receptor positive (HCC)  03/30/2024 Initial Diagnosis   Screening mammogram detected right upper outer quadrant mass 2.6 cm by mammogram, by ultrasound it measured 2.2 cm at 10 o'clock position, axilla negative.  Left breast: Cysts: Aspirated Biopsy: Grade 3 IDC ER 2%, PR 0%, Ki67 60%, HER2 negative   04/05/2024 Cancer Staging   Staging form: Breast, AJCC 8th Edition - Clinical: Stage IIB (cT2, cN0, cM0, G3, ER-, PR-, HER2-) - Signed by Odean Potts, MD on 04/05/2024 Stage prefix: Initial diagnosis Histologic grading system: 3 grade system   04/15/2024 Genetic Testing   Negative genetic testing. Report date: 04/15/2024  The [facility]+RNAinsight Panel includes sequencing, rearrangement analysis, and RNA analysis for the following 40 genes: APC, ATM, BAP1, BARD1, BMPR1A, BRCA1, BRCA2, BRIP1, CDH1, CDKN2A, CHEK2, FH, FLCN, MET, MLH1, MSH2, MSH6, MUTYH, NF1, NTHL1, PALB2, PMS2, PTEN, RAD51C, RAD51D, RPS20, SMAD4, STK11, TP53, TSC1, TSC2 and VHL (sequencing and deletion/duplication); AXIN2, HOXB13, MBD4, MSH3, POLD1 and POLE (sequencing only); EPCAM and GREM1 (deletion/duplication only). RNA data is routinely analyzed for use in variant interpretation for all genes.    04/21/2024 -  Chemotherapy   Patient is on Treatment Plan :  BREAST Pembrolizumab  (200) D1 + Carboplatin  (1.5) D1,8,15 + Paclitaxel  (80) D1,8,15 q21d X 4 cycles / Pembrolizumab  (200) D1 + AC D1 q21d x 4 cycles      Genetic Testing   Negative genetic testing. Report date: 04/15/2024  The [facility] Cancer[facility]+RNAinsight Panel includes sequencing, rearrangement analysis, and RNA analysis for the following 40 genes: APC, ATM, BAP1, BARD1, BMPR1A, BRCA1, BRCA2, BRIP1, CDH1, CDKN2A, CHEK2, FH, FLCN, MET, MLH1, MSH2, MSH6, MUTYH, NF1, NTHL1, PALB2, PMS2, PTEN, RAD51C, RAD51D, RPS20, SMAD4, STK11, TP53, TSC1, TSC2 and VHL (sequencing and deletion/duplication); AXIN2, HOXB13, MBD4, MSH3, POLD1 and POLE (sequencing only); EPCAM and GREM1 (deletion/duplication only). RNA data is routinely analyzed for use in variant interpretation for all genes.      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  She is receiving Taxol  and carboplatin  for ER-positive right breast cancer and has completed 8 of 12 planned cycles. She developed numbness and tingling in her hands and feet, worse in the evenings but present all day, with a light feeling in her thighs and feet and decreased plantar sensation, even when barefoot.  The neuropathy causes functional impairment with dropping objects, difficulty with fine motor tasks, and slipping on stairs, which she attributes to decreased sensation. She has no pain but symptoms interfere with sleep. She takes tramadol  at night and uses a bed vibrator/massage pad for symptomatic relief.  Symptoms improved during a two-week chemotherapy break but worsened after treatment resumed. Neuropathy persists despite dose reductions.  She has mild chronic  kidney  dysfunction with stable creatinine around 1.2 and normal electrolytes. Liver enzymes were previously elevated with Taxol  but are currently monitored and are not limiting treatment.     ALLERGIES:  is allergic to penicillins.  MEDICATIONS:  Current Outpatient Medications  Medication Sig Dispense Refill   acetaminophen  (TYLENOL ) 500 MG tablet Take 1,000 mg by mouth every 6 (six) hours as needed for moderate pain (pain score 4-6) or mild pain (pain score 1-3).     atorvastatin (LIPITOR) 20 MG tablet Take 20 mg by mouth every evening.     cetirizine (ZYRTEC) 10 MG tablet Take 10 mg by mouth daily.     dexamethasone  (DECADRON ) 4 MG tablet Take 2 tablets by mouth daily for 2 days, start the day after chemotherapy. Take with food. 30 tablet 1   docusate sodium  (COLACE) 100 MG capsule Take 2 tablets daily until you have a normal bowel movement, then take 1 tablet daily until your bowel movements are soft and regular. 60 capsule 0   fluticasone (FLONASE) 50 MCG/ACT nasal spray Place 2 sprays into both nostrils daily as needed for allergies or rhinitis.     hydrochlorothiazide (HYDRODIURIL) 25 MG tablet Take 25 mg by mouth daily.     hydrOXYzine (ATARAX) 25 MG tablet Take 25 mg by mouth 3 (three) times daily as needed for itching or anxiety.     lidocaine -prilocaine  (EMLA ) cream Apply to affected area once 30 g 3   losartan (COZAAR) 50 MG tablet Take 50 mg by mouth daily.     omeprazole  (PRILOSEC) 40 MG capsule Take 1 capsule (40 mg total) by mouth daily. 30 capsule 1   ondansetron  (ZOFRAN ) 8 MG tablet Take 1 tablet (8 mg total) by mouth every 8 (eight) hours as needed for nausea or vomiting. Start on the third day after chemotherapy. 30 tablet 1   polyethylene glycol powder (GLYCOLAX /MIRALAX ) 17 GM/SCOOP powder Take 17 g by mouth 2 (two) times daily. Until daily soft stools OTC 238 g 0   potassium chloride  (KLOR-CON  M) 10 MEQ tablet Take 1 tablet (10 mEq total) by mouth daily. 30 tablet 0    prochlorperazine  (COMPAZINE ) 10 MG tablet Take 1 tablet (10 mg total) by mouth every 6 (six) hours as needed for nausea or vomiting. 30 tablet 1   traMADol  (ULTRAM ) 50 MG tablet Take 1 tablet (50 mg total) by mouth every 6 (six) hours as needed for moderate pain (pain score 4-6) or severe pain (pain score 7-10). 25 tablet 0   Vitamin D, Ergocalciferol, (DRISDOL) 1.25 MG (50000 UNIT) CAPS capsule Take 50,000 Units by mouth 2 (two) times a week.     No current facility-administered medications for this visit.    PHYSICAL EXAMINATION: ECOG PERFORMANCE STATUS: 1 - Symptomatic but completely ambulatory  Vitals:   07/03/24 1123  BP: (!) 131/90  Pulse: 80  Resp: 17  Temp: 98.2 F (36.8 C)  SpO2: 100%   Filed Weights   07/03/24 1123  Weight: 221 lb 6.4 oz (100.4 kg)    LABORATORY DATA:  I have reviewed the data as listed    Latest Ref Rng & Units 07/03/2024   10:59 AM 06/23/2024    7:42 AM 06/16/2024   11:18 AM  CMP  Glucose 70 - 99 mg/dL 879  865  899   BUN 6 - 20 mg/dL 15  14  11    Creatinine 0.44 - 1.00 mg/dL 8.79  8.80  8.86   Sodium 135 - 145 mmol/L 142  139  139   Potassium 3.5 - 5.1 mmol/L 3.5  3.3  3.6   Chloride 98 - 111 mmol/L 106  102  104   CO2 22 - 32 mmol/L 24  24  24    Calcium 8.9 - 10.3 mg/dL 9.6  9.2  9.2   Total Protein 6.5 - 8.1 g/dL 7.6  6.8  6.9   Total Bilirubin 0.0 - 1.2 mg/dL 0.5  0.5  0.8   Alkaline Phos 38 - 126 U/L 90  56  64   AST 15 - 41 U/L 64  28  22   ALT 0 - 44 U/L 93  39  36     Lab Results  Component Value Date   WBC 7.0 07/03/2024   HGB 10.2 (L) 07/03/2024   HCT 30.0 (L) 07/03/2024   MCV 90.1 07/03/2024   PLT 168 07/03/2024   NEUTROABS 2.5 07/03/2024    ASSESSMENT & PLAN:  Malignant neoplasm of upper-outer quadrant of right breast in female, estrogen receptor positive (HCC) 03/30/2024:Screening mammogram detected right upper outer quadrant mass 2.6 cm by mammogram, by ultrasound it measured 2.2 cm at 10 o'clock position, axilla  negative.  Left breast: Cysts: Aspirated Biopsy: Grade 3 IDC ER 2%, PR 0%, Ki67 60%, HER2 negative   Treatment plan: Neoadjuvant chemotherapy with Taxol  carbo Keytruda  followed by Adriamycin Cytoxan Keytruda  followed by Keytruda  maintenance for 1 year started 04/21/2024 Breast conserving surgery with sentinel lymph node biopsy Adjuvant radiation ------------------------------------------------------------------------------------------------------------------------------------ Current treatment: Completed 8 cycles of Taxol  carbo Keytruda , discontinued for neuropathy.  Today supposed with cycle 1 Adriamycin Cytoxan Keytruda  (being canceled because of port dysfunction) Chemo toxicities: Heartburn: Managed with pantoprazole Bone pain Mild dizziness intermittently Darkening of nailbeds Nausea: Slightly better with reduced dosage Chemo-induced peripheral neuropathy: Dose has been reduced to 60 mg/m Neutropenia: Neupogen  injections have been given Diarrhea and dysgeusia   Port dysfunction: We will obtain a dye study and see if it can be improved.  Will postpone her chemo to next week. Assessment & Plan Chronic kidney dysfunction Mild, stable chronic kidney dysfunction with creatinine at 1.2. Electrolytes normal. Renal function adequate for chemotherapy. Monitoring liver enzymes. - Reviewed kidney function and confirmed suitability for chemotherapy. - Advised increased oral hydration, especially in the first week after chemotherapy, to support renal clearance. - Will continue to monitor kidney function and electrolytes during treatment. - Scheduled discussion regarding discontinuation of potassium supplementation at a future visit.      No orders of the defined types were placed in this encounter.  The patient has a good understanding of the overall plan. she agrees with it. she will call with any problems that may develop before the next visit here.  I personally spent a total of 45  minutes in the care of the patient today including preparing to see the patient, getting/reviewing separately obtained history, performing a medically appropriate exam/evaluation, counseling and educating, placing orders, referring and communicating with other health care professionals, documenting clinical information in the EHR, independently interpreting results, communicating results, and coordinating care.   Viinay K Veryl Winemiller, MD 07/03/2024    "

## 2024-07-03 NOTE — Progress Notes (Signed)
 Patient arrived to infusion with port accessed. No issues beforehand, but no blood return observed in infusion. Cathflo administered at 1330. Still no blood return observed at 1400 so cathflo pushed back and another hour wait started. Still no blood return observed after waiting period so Dr. Odean, MD notified. Per MD, no tx today and pt will be scheduled for a dye study. Pt informed and verbalized understanding of plan. Cathflo removed and port flushed still without blood return. Port de-accessed and pt ambulatory to lobby.

## 2024-07-03 NOTE — Patient Instructions (Addendum)
 SABRA

## 2024-07-03 NOTE — Assessment & Plan Note (Signed)
 03/30/2024:Screening mammogram detected right upper outer quadrant mass 2.6 cm by mammogram, by ultrasound it measured 2.2 cm at 10 o'clock position, axilla negative.  Left breast: Cysts: Aspirated Biopsy: Grade 3 IDC ER 2%, PR 0%, Ki67 60%, HER2 negative   Treatment plan: Neoadjuvant chemotherapy with Taxol  carbo Keytruda  followed by Adriamycin Cytoxan Keytruda  followed by Keytruda  maintenance for 1 year started 04/21/2024 Breast conserving surgery with sentinel lymph node biopsy Adjuvant radiation ------------------------------------------------------------------------------------------------------------------------------------ Current treatment: Cycle 9 Taxol  carbo Keytruda  Chemo toxicities: Heartburn: Managed with pantoprazole Bone pain Mild dizziness intermittently Darkening of nailbeds Nausea: Slightly better with reduced dosage Chemo-induced peripheral neuropathy: Dose has been reduced to 60 mg/m Neutropenia: Neupogen  injections have been given Diarrhea and dysgeusia   Return to clinic weekly for chemotherapy

## 2024-07-04 ENCOUNTER — Encounter: Payer: Self-pay | Admitting: Licensed Clinical Social Worker

## 2024-07-04 ENCOUNTER — Encounter: Payer: Self-pay | Admitting: Hematology and Oncology

## 2024-07-04 DIAGNOSIS — Z95828 Presence of other vascular implants and grafts: Secondary | ICD-10-CM

## 2024-07-04 DIAGNOSIS — C50411 Malignant neoplasm of upper-outer quadrant of right female breast: Secondary | ICD-10-CM

## 2024-07-04 NOTE — Progress Notes (Signed)
 Error

## 2024-07-04 NOTE — Progress Notes (Signed)
 [facility] Clinical Social Work  CSW received notice that patient was approved for assistance through [employer].  Sent information on approval to patient.    Daulton Harbaugh E Loucinda Croy, LCSW

## 2024-07-05 ENCOUNTER — Inpatient Hospital Stay

## 2024-07-05 ENCOUNTER — Other Ambulatory Visit: Payer: Self-pay

## 2024-07-05 ENCOUNTER — Ambulatory Visit (HOSPITAL_COMMUNITY)
Admission: RE | Admit: 2024-07-05 | Discharge: 2024-07-05 | Disposition: A | Discharge status: Home / Self Care | Source: Ambulatory Visit | Attending: Hematology and Oncology | Admitting: Hematology and Oncology

## 2024-07-05 DIAGNOSIS — Z452 Encounter for adjustment and management of vascular access device: Secondary | ICD-10-CM | POA: Diagnosis present

## 2024-07-05 DIAGNOSIS — C50411 Malignant neoplasm of upper-outer quadrant of right female breast: Secondary | ICD-10-CM | POA: Insufficient documentation

## 2024-07-05 DIAGNOSIS — Z170 Estrogen receptor positive status [ER+]: Secondary | ICD-10-CM | POA: Diagnosis not present

## 2024-07-05 DIAGNOSIS — Z95828 Presence of other vascular implants and grafts: Secondary | ICD-10-CM

## 2024-07-05 HISTORY — PX: IR CV LINE INJECTION: IMG2294

## 2024-07-05 MED ORDER — HEPARIN SOD (PORK) LOCK FLUSH 100 UNIT/ML IV SOLN
INTRAVENOUS | Status: AC
  Filled 2024-07-05: qty 5

## 2024-07-05 MED ORDER — IOHEXOL 300 MG/ML  SOLN
50.0000 mL | Freq: Once | INTRAMUSCULAR | Status: AC | PRN
  Administered 2024-07-05: 15 mL via INTRAVENOUS

## 2024-07-05 MED ORDER — HEPARIN SOD (PORK) LOCK FLUSH 100 UNIT/ML IV SOLN
500.0000 [IU] | Freq: Once | INTRAVENOUS | Status: AC
  Administered 2024-07-05: 500 [IU] via INTRAVENOUS

## 2024-07-06 ENCOUNTER — Inpatient Hospital Stay

## 2024-07-06 ENCOUNTER — Encounter: Payer: Self-pay | Admitting: *Deleted

## 2024-07-06 ENCOUNTER — Encounter: Payer: Self-pay | Admitting: Hematology and Oncology

## 2024-07-07 ENCOUNTER — Inpatient Hospital Stay

## 2024-07-07 ENCOUNTER — Telehealth: Payer: Self-pay

## 2024-07-07 NOTE — Telephone Encounter (Signed)
 [facility] [employer] approved with retro coverage to 02/28/2024, ongoing from 05/29/2024 x 12 months. MID: [id number] T. Patient informed.

## 2024-07-10 ENCOUNTER — Inpatient Hospital Stay: Admitting: Adult Health

## 2024-07-10 ENCOUNTER — Telehealth: Payer: Self-pay | Admitting: *Deleted

## 2024-07-10 ENCOUNTER — Inpatient Hospital Stay

## 2024-07-10 MED FILL — Fosaprepitant Dimeglumine For IV Infusion 150 MG (Base Eq): INTRAVENOUS | Qty: 5 | Status: AC

## 2024-07-10 NOTE — Telephone Encounter (Signed)
 [id number], ICE COMPRESS: RANDOMIZED TRIAL OF LIMB CRYOCOMPRESSION VERSUS CONTINUOUS COMPRESSION VERSUS LOW CYCLIC COMPRESSION FOR THE PREVENTION OF TAXANE-INDUCED PERIPHERAL NEUROPATHY  Informed patient that research nurse plans to complete the week 12 assessments for study at her next appointment scheduled for tomorrow. Patient will have questionnaires to complete and neuropathy assessment conducted by this research nurse prior to infusion appointment. Patient agreed and verbalized understanding. Cherylyn Hoard, BSN, RN, [employer] Research Nurse II [phone] 07/10/2024 9:45 AM

## 2024-07-11 ENCOUNTER — Encounter: Payer: Self-pay | Admitting: *Deleted

## 2024-07-11 ENCOUNTER — Inpatient Hospital Stay

## 2024-07-11 ENCOUNTER — Inpatient Hospital Stay: Admitting: Hematology and Oncology

## 2024-07-11 VITALS — BP 143/92 | HR 78 | Temp 97.3°F | Resp 18 | Wt 217.0 lb

## 2024-07-11 VITALS — BP 124/79 | HR 85 | Resp 18

## 2024-07-11 DIAGNOSIS — C50411 Malignant neoplasm of upper-outer quadrant of right female breast: Secondary | ICD-10-CM

## 2024-07-11 DIAGNOSIS — Z170 Estrogen receptor positive status [ER+]: Secondary | ICD-10-CM | POA: Diagnosis not present

## 2024-07-11 DIAGNOSIS — Z5112 Encounter for antineoplastic immunotherapy: Secondary | ICD-10-CM | POA: Diagnosis not present

## 2024-07-11 LAB — CMP (CANCER CENTER ONLY)
ALT: 36 U/L (ref 0–44)
AST: 22 U/L (ref 15–41)
Albumin: 4.3 g/dL (ref 3.5–5.0)
Alkaline Phosphatase: 65 U/L (ref 38–126)
Anion gap: 11 (ref 5–15)
BUN: 13 mg/dL (ref 6–20)
CO2: 25 mmol/L (ref 22–32)
Calcium: 9.4 mg/dL (ref 8.9–10.3)
Chloride: 103 mmol/L (ref 98–111)
Creatinine: 1.13 mg/dL — ABNORMAL HIGH (ref 0.44–1.00)
GFR, Estimated: 59 mL/min — ABNORMAL LOW
Glucose, Bld: 130 mg/dL — ABNORMAL HIGH (ref 70–99)
Potassium: 3.4 mmol/L — ABNORMAL LOW (ref 3.5–5.1)
Sodium: 138 mmol/L (ref 135–145)
Total Bilirubin: 0.6 mg/dL (ref 0.0–1.2)
Total Protein: 7.5 g/dL (ref 6.5–8.1)

## 2024-07-11 LAB — CBC WITH DIFFERENTIAL (CANCER CENTER ONLY)
Abs Immature Granulocytes: 0.01 K/uL (ref 0.00–0.07)
Basophils Absolute: 0 K/uL (ref 0.0–0.1)
Basophils Relative: 1 %
Eosinophils Absolute: 0 K/uL (ref 0.0–0.5)
Eosinophils Relative: 0 %
HCT: 29.8 % — ABNORMAL LOW (ref 36.0–46.0)
Hemoglobin: 10.1 g/dL — ABNORMAL LOW (ref 12.0–15.0)
Immature Granulocytes: 0 %
Lymphocytes Relative: 41 %
Lymphs Abs: 2.6 K/uL (ref 0.7–4.0)
MCH: 30.5 pg (ref 26.0–34.0)
MCHC: 33.9 g/dL (ref 30.0–36.0)
MCV: 90 fL (ref 80.0–100.0)
Monocytes Absolute: 0.4 K/uL (ref 0.1–1.0)
Monocytes Relative: 6 %
Neutro Abs: 3.3 K/uL (ref 1.7–7.7)
Neutrophils Relative %: 52 %
Platelet Count: 233 K/uL (ref 150–400)
RBC: 3.31 MIL/uL — ABNORMAL LOW (ref 3.87–5.11)
RDW: 18 % — ABNORMAL HIGH (ref 11.5–15.5)
WBC Count: 6.3 K/uL (ref 4.0–10.5)
nRBC: 0 % (ref 0.0–0.2)

## 2024-07-11 MED ORDER — DEXAMETHASONE SOD PHOSPHATE PF 10 MG/ML IJ SOLN
10.0000 mg | Freq: Once | INTRAMUSCULAR | Status: AC
  Administered 2024-07-11: 10 mg via INTRAVENOUS
  Filled 2024-07-11: qty 1

## 2024-07-11 MED ORDER — SODIUM CHLORIDE 0.9 % IV SOLN
200.0000 mg | Freq: Once | INTRAVENOUS | Status: AC
  Administered 2024-07-11: 200 mg via INTRAVENOUS
  Filled 2024-07-11: qty 200

## 2024-07-11 MED ORDER — PALONOSETRON HCL INJECTION 0.25 MG/5ML
0.2500 mg | Freq: Once | INTRAVENOUS | Status: AC
  Administered 2024-07-11: 0.25 mg via INTRAVENOUS
  Filled 2024-07-11: qty 5

## 2024-07-11 MED ORDER — SODIUM CHLORIDE 0.9 % IV SOLN: INTRAVENOUS | Status: DC

## 2024-07-11 MED ORDER — DOXORUBICIN HCL CHEMO IV INJECTION 2 MG/ML
60.0000 mg/m2 | Freq: Once | INTRAVENOUS | Status: AC
  Administered 2024-07-11: 126 mg via INTRAVENOUS
  Filled 2024-07-11: qty 63

## 2024-07-11 MED ORDER — SODIUM CHLORIDE 0.9 % IV SOLN
600.0000 mg/m2 | Freq: Once | INTRAVENOUS | Status: AC
  Administered 2024-07-11: 1260 mg via INTRAVENOUS
  Filled 2024-07-11: qty 63

## 2024-07-11 MED ORDER — SODIUM CHLORIDE 0.9 % IV SOLN
150.0000 mg | Freq: Once | INTRAVENOUS | Status: AC
  Administered 2024-07-11: 150 mg via INTRAVENOUS
  Filled 2024-07-11: qty 150

## 2024-07-11 NOTE — Assessment & Plan Note (Signed)
 03/30/2024:Screening mammogram detected right upper outer quadrant mass 2.6 cm by mammogram, by ultrasound it measured 2.2 cm at 10 o'clock position, axilla negative.  Left breast: Cysts: Aspirated Biopsy: Grade 3 IDC ER 2%, PR 0%, Ki67 60%, HER2 negative   Treatment plan: Neoadjuvant chemotherapy with Taxol  carbo Keytruda  followed by Adriamycin  Cytoxan  Keytruda  followed by Keytruda  maintenance for 1 year started 04/21/2024 Breast conserving surgery with sentinel lymph node biopsy Adjuvant radiation ------------------------------------------------------------------------------------------------------------------------------------ Current treatment: Completed 8 cycles of Taxol  carbo Keytruda , discontinued for neuropathy.  Today is cycle 1 Adriamycin  Cytoxan  Keytruda    Chemo toxicities: Heartburn: Managed with pantoprazole Bone pain Mild dizziness intermittently Darkening of nailbeds Nausea: Slightly better with reduced dosage Chemo-induced peripheral neuropathy: Dose has been reduced to 60 mg/m Neutropenia: Neupogen  injections have been given Diarrhea and dysgeusia   Port dysfunction: Dye study 07/05/2024: No occlusion

## 2024-07-11 NOTE — Progress Notes (Signed)
 "  Patient Care Team: Lenon Nell SAILOR, FNP as PCP - General (Nurse Practitioner) Gerome, Devere HERO, RN as Oncology Nurse Navigator Tyree Nanetta SAILOR, RN as Oncology Nurse Navigator Vernetta Berg, MD as Consulting Physician (General Surgery) Odean Potts, MD as Consulting Physician (Hematology and Oncology) Dewey Rush, MD as Consulting Physician (Radiation Oncology)  DIAGNOSIS:  Encounter Diagnosis  Name Primary?   Malignant neoplasm of upper-outer quadrant of right breast in female, estrogen receptor positive (HCC) Yes    SUMMARY OF ONCOLOGIC HISTORY: Oncology History  Malignant neoplasm of upper-outer quadrant of right breast in female, estrogen receptor positive (HCC)  03/30/2024 Initial Diagnosis   Screening mammogram detected right upper outer quadrant mass 2.6 cm by mammogram, by ultrasound it measured 2.2 cm at 10 o'clock position, axilla negative.  Left breast: Cysts: Aspirated Biopsy: Grade 3 IDC ER 2%, PR 0%, Ki67 60%, HER2 negative   04/05/2024 Cancer Staging   Staging form: Breast, AJCC 8th Edition - Clinical: Stage IIB (cT2, cN0, cM0, G3, ER-, PR-, HER2-) - Signed by Odean Potts, MD on 04/05/2024 Stage prefix: Initial diagnosis Histologic grading system: 3 grade system   04/15/2024 Genetic Testing   Negative genetic testing. Report date: 04/15/2024  The [facility] CancerNext+RNAinsight Panel includes sequencing, rearrangement analysis, and RNA analysis for the following 40 genes: APC, ATM, BAP1, BARD1, BMPR1A, BRCA1, BRCA2, BRIP1, CDH1, CDKN2A, CHEK2, FH, FLCN, MET, MLH1, MSH2, MSH6, MUTYH, NF1, NTHL1, PALB2, PMS2, PTEN, RAD51C, RAD51D, RPS20, SMAD4, STK11, TP53, TSC1, TSC2 and VHL (sequencing and deletion/duplication); AXIN2, HOXB13, MBD4, MSH3, POLD1 and POLE (sequencing only); EPCAM and GREM1 (deletion/duplication only). RNA data is routinely analyzed for use in variant interpretation for all genes.    04/21/2024 -  Chemotherapy   Patient is on Treatment Plan :  BREAST Pembrolizumab  (200) D1 + Carboplatin  (1.5) D1,8,15 + Paclitaxel  (80) D1,8,15 q21d X 4 cycles / Pembrolizumab  (200) D1 + AC D1 q21d x 4 cycles      Genetic Testing   Negative genetic testing. Report date: 04/15/2024  The [facility] CancerNext+RNAinsight Panel includes sequencing, rearrangement analysis, and RNA analysis for the following 40 genes: APC, ATM, BAP1, BARD1, BMPR1A, BRCA1, BRCA2, BRIP1, CDH1, CDKN2A, CHEK2, FH, FLCN, MET, MLH1, MSH2, MSH6, MUTYH, NF1, NTHL1, PALB2, PMS2, PTEN, RAD51C, RAD51D, RPS20, SMAD4, STK11, TP53, TSC1, TSC2 and VHL (sequencing and deletion/duplication); AXIN2, HOXB13, MBD4, MSH3, POLD1 and POLE (sequencing only); EPCAM and GREM1 (deletion/duplication only). RNA data is routinely analyzed for use in variant interpretation for all genes.      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  She still has intermittent sharp neuropathic sensations on the plantar surfaces of her feet, though prior sensation testing was normal. Nausea has been problematic with chemotherapy and she is instructed to use antiemetics proactively with the new regimen.  Recent labs show WBC 6.3, stable anemia with hemoglobin about 10, and normal platelets. Potassium is slightly low and she continues oral supplementation with advice to increase dietary intake. Kidney function has improved and liver function is now normal.  She is able to drive to appointments and is coordinating treatment timing  with her sister.      ALLERGIES:  is allergic to penicillins.  MEDICATIONS:  Current Outpatient Medications  Medication Sig Dispense Refill   acetaminophen  (TYLENOL ) 500 MG tablet Take 1,000 mg by mouth every 6 (six) hours as needed for moderate pain (pain score 4-6) or mild pain (pain score 1-3).     atorvastatin (LIPITOR) 20 MG tablet Take 20 mg by mouth every evening.     cetirizine (ZYRTEC) 10 MG tablet Take 10 mg by mouth daily.     dexamethasone  (DECADRON ) 4 MG tablet Take 2 tablets by mouth daily for 2 days, start the day after chemotherapy. Take with food. 30 tablet 1   docusate sodium  (COLACE) 100 MG capsule Take 2 tablets daily until you have a normal bowel movement, then take 1 tablet daily until your bowel movements are soft and regular. 60 capsule 0   fluticasone (FLONASE) 50 MCG/ACT nasal spray Place 2 sprays into both nostrils daily as needed for allergies or rhinitis.     hydrochlorothiazide (HYDRODIURIL) 25 MG tablet Take 25 mg by mouth daily.     hydrOXYzine (ATARAX) 25 MG tablet Take 25 mg by mouth 3 (three) times daily as needed for itching or anxiety.     lidocaine -prilocaine  (EMLA ) cream Apply to affected area once 30 g 3   losartan (COZAAR) 50 MG tablet Take 50 mg by mouth daily.     omeprazole  (PRILOSEC) 40 MG capsule Take 1 capsule (40 mg total) by mouth daily. 30 capsule 1   ondansetron  (ZOFRAN ) 8 MG tablet Take 1 tablet (8 mg total) by mouth every 8 (eight) hours as needed for nausea or vomiting. Start on the third day after chemotherapy. 30 tablet 1   polyethylene glycol powder (GLYCOLAX /MIRALAX ) 17 GM/SCOOP powder Take 17 g by mouth 2 (two) times daily. Until daily soft stools OTC 238 g 0   potassium chloride  (KLOR-CON  M) 10 MEQ tablet Take 1 tablet (10 mEq total) by mouth daily. 30 tablet 0   prochlorperazine  (COMPAZINE ) 10 MG tablet Take 1 tablet (10 mg total) by mouth every 6 (six) hours as needed for nausea or vomiting. 30 tablet 1   traMADol  (ULTRAM ) 50 MG  tablet Take 1 tablet (50 mg total) by mouth every 6 (six) hours as needed for moderate pain (pain score 4-6) or severe pain (pain score 7-10). 25 tablet 0   Vitamin D, Ergocalciferol, (DRISDOL) 1.25 MG (50000 UNIT) CAPS capsule Take 50,000 Units by mouth 2 (two) times a week.     No current facility-administered medications for this visit.    PHYSICAL EXAMINATION: ECOG PERFORMANCE STATUS: 1 - Symptomatic but completely ambulatory  Vitals:   07/11/24 1443  BP: (!) 143/92  Pulse: 78  Resp: 18  Temp: (!) 97.3 F (36.3 C)  SpO2: 100%   Filed Weights   07/11/24 1443  Weight: 217 lb (98.4 kg)    Physical Exam   (exam performed in the presence of a chaperone)  LABORATORY DATA:  I have reviewed the data as listed    Latest Ref Rng & Units 07/11/2024    2:08 PM 07/03/2024   10:59 AM 06/23/2024    7:42 AM  CMP  Glucose 70 - 99 mg/dL 869  879  865   BUN 6 - 20 mg/dL 13  15  14    Creatinine 0.44 - 1.00 mg/dL 8.86  8.79  8.80   Sodium 135 - 145 mmol/L 138  142  139   Potassium 3.5 - 5.1 mmol/L  3.4  3.5  3.3   Chloride 98 - 111 mmol/L 103  106  102   CO2 22 - 32 mmol/L 25  24  24    Calcium 8.9 - 10.3 mg/dL 9.4  9.6  9.2   Total Protein 6.5 - 8.1 g/dL 7.5  7.6  6.8   Total Bilirubin 0.0 - 1.2 mg/dL 0.6  0.5  0.5   Alkaline Phos 38 - 126 U/L 65  90  56   AST 15 - 41 U/L 22  64  28   ALT 0 - 44 U/L 36  93  39     Lab Results  Component Value Date   WBC 6.3 07/11/2024   HGB 10.1 (L) 07/11/2024   HCT 29.8 (L) 07/11/2024   MCV 90.0 07/11/2024   PLT 233 07/11/2024   NEUTROABS 3.3 07/11/2024    ASSESSMENT & PLAN:  Malignant neoplasm of upper-outer quadrant of right breast in female, estrogen receptor positive (HCC) 03/30/2024:Screening mammogram detected right upper outer quadrant mass 2.6 cm by mammogram, by ultrasound it measured 2.2 cm at 10 o'clock position, axilla negative.  Left breast: Cysts: Aspirated Biopsy: Grade 3 IDC ER 2%, PR 0%, Ki67 60%, HER2 negative   Treatment  plan: Neoadjuvant chemotherapy with Taxol  carbo Keytruda  followed by Adriamycin  Cytoxan  Keytruda  followed by Keytruda  maintenance for 1 year started 04/21/2024 Breast conserving surgery with sentinel lymph node biopsy Adjuvant radiation ------------------------------------------------------------------------------------------------------------------------------------ Current treatment: Completed 8 cycles of Taxol  carbo Keytruda , discontinued for neuropathy.  Today is cycle 1 Adriamycin  Cytoxan  Keytruda    Chemo toxicities: Heartburn: Managed with pantoprazole Bone pain Mild dizziness intermittently Darkening of nailbeds Nausea: Slightly better with reduced dosage Chemo-induced peripheral neuropathy: Dose has been reduced to 60 mg/m Neutropenia: Neupogen  injections have been given Diarrhea and dysgeusia   Port dysfunction: Dye study 07/05/2024: No occlusion  ------------------------------------- Assessment and Plan Assessment & Plan Chemotherapy-induced peripheral neuropathy Peripheral neuropathy developed during initial chemotherapy, leading to early discontinuation of paclitaxel . Symptoms persist unchanged. - Monitored neuropathy symptoms with new chemotherapy regimen. - Assessed functional impairment; considered dose modifications if symptoms worsen. - Provided supportive care; considered neurology or physical therapy referral if symptoms progress.  Chronic kidney dysfunction Renal function improved and stable, but risk remains with ongoing chemotherapy. - Monitored renal function before each chemotherapy cycle. - Adjusted chemotherapy dosing based on renal function. - Encouraged hydration and reviewed nephrotoxic medication exposures.      No orders of the defined types were placed in this encounter.  The patient has a good understanding of the overall plan. she agrees with it. she will call with any problems that may develop before the next visit here.  I personally spent a  total of 30 minutes in the care of the patient today including preparing to see the patient, getting/reviewing separately obtained history, performing a medically appropriate exam/evaluation, counseling and educating, placing orders, referring and communicating with other health care professionals, documenting clinical information in the EHR, independently interpreting results, communicating results, and coordinating care.   Viinay K Brockton Mckesson, MD 07/11/2024    "

## 2024-07-11 NOTE — Research (Addendum)
 D7794, ICE COMPRESS: RANDOMIZED TRIAL OF LIMB CRYOCOMPRESSION VERSUS CONTINUOUS COMPRESSION VERSUS LOW CYCLIC COMPRESSION FOR THE PREVENTION OF TAXANE-INDUCED PERIPHERAL NEUROPATHY   Arm 2 Continuous Compression- Week 12 Assessments Patient arrives today unaccompanied for her scheduled lab/MD/chemo appointments.   PROs Provided PROs to patient at registration prior to her other appointments. Collected and checked for completeness and accuracy.   Neuropathy Assessment Neuropathy assessment, including neuropen, tuning fork and Get up & Go test, completed by this RN per protocol.   Supplemental Agents Patient denies taking Duloxetine for any reason. Pt is taking tramadol  prn at night for the neuropathy discomfort since it sometimes keeps her from sleeping. Pt denies taking vitamins and/or supplements for symptom management of peripheral neuropathy. Pt denies receiving any complimentary alternative therapies for symptom management of peripheral neuropathy.    Adverse Events Solicited AEs reviewed with patient. She did not have any device related AEs.  She reports ongoing numbness and tingling in her fingertips and toes which she says has not changed since last week. Patient denies any motor neuropathy symptoms.  Patient continues to report ongoing discoloration in the nail beds of right and left hands. She also has some darkening of the skin on the dorsum of bilateral hands below the knuckles. See AE table below.   Treatment 8 (06/16/24-07/11/24) Adverse Event CTCAE Grade Onset date Resolved date Relationship to Study Intervention Relationship to Taxane Action Taken Comments  Skin atrophy (solicited) 0             Skin hyperpigmentation (solicited) 1  06/21/24  ongoing  Unrelated Definite  none    Skin hypopigmentation (solicited) 0             Skin induration (solicited) 0             Skin ulceration (solicited) 0             Rash maculopapular (solicited) 0             Nail changes (solicited)  0             Cold intolerance (solicited) (general disorders and administration site conditions- other) 0             Frostbite (solicited) (skin and subcutaneous tissue disorders- other) 0             Neuropathy Sensory 1 05/27/24 06/16/24 Unrelated Definite none  Pt reports started after treatment 5  Neuropathy Sensory 2 06/16/24 ongoing Unrelated Definite Taxol  discontinued prior to treatment 9   Nail Discoloration 1 05/19/24 ongoing Unrelated Definite none Pt reports started after treatment 3     PLAN:  Next study assessment due in 12 weeks (10/03/24 +/- 4 wks).  Research nurse will plan this assessment during a scheduled clinic visit. Patient verbalized understanding and agreement to this plan. Patient was thanked for her ongoing participation in this study and has been provided direct contact information and is encouraged to contact this Nurse for any needs or questions.  Cherylyn Hoard, BSN, RN, [employer] Research Nurse II [phone] 07/11/2024

## 2024-07-11 NOTE — Patient Instructions (Signed)
 [facility] - A DEPT OF [facility]  Discharge Instructions: Thank you for choosing [facility] to provide your oncology and hematology care.   If you have a lab appointment with the Cancer Center, please go directly to the Cancer Center and check in at the registration area.   Wear comfortable clothing and clothing appropriate for easy access to any Portacath or PICC line.   We strive to give you quality time with your provider. You may need to reschedule your appointment if you arrive late (15 or more minutes).  Arriving late affects you and other patients whose appointments are after yours.  Also, if you miss three or more appointments without notifying the office, you may be dismissed from the clinic at the provider's discretion.      For prescription refill requests, have your pharmacy contact our office and allow 72 hours for refills to be completed.    Today you received the following chemotherapy and/or immunotherapy agents Keytruda, Adriamycin, Cytoxan.      To help prevent nausea and vomiting after your treatment, we encourage you to take your nausea medication as directed.  BELOW ARE SYMPTOMS THAT SHOULD BE REPORTED IMMEDIATELY: *FEVER GREATER THAN 100.4 F (38 C) OR HIGHER *CHILLS OR SWEATING *NAUSEA AND VOMITING THAT IS NOT CONTROLLED WITH YOUR NAUSEA MEDICATION *UNUSUAL SHORTNESS OF BREATH *UNUSUAL BRUISING OR BLEEDING *URINARY PROBLEMS (pain or burning when urinating, or frequent urination) *BOWEL PROBLEMS (unusual diarrhea, constipation, pain near the anus) TENDERNESS IN MOUTH AND THROAT WITH OR WITHOUT PRESENCE OF ULCERS (sore throat, sores in mouth, or a toothache) UNUSUAL RASH, SWELLING OR PAIN  UNUSUAL VAGINAL DISCHARGE OR ITCHING   Items with * indicate a potential emergency and should be followed up as soon as possible or go to the Emergency Department if any problems should occur.  Please show the CHEMOTHERAPY ALERT  CARD or IMMUNOTHERAPY ALERT CARD at check-in to the Emergency Department and triage nurse.  Should you have questions after your visit or need to cancel or reschedule your appointment, please contact [facility] - A DEPT OF [facility]  Dept: [phone]  and follow the prompts.  Office hours are 8:00 a.m. to 4:30 p.m. Monday - Friday. Please note that voicemails left after 4:00 p.m. may not be returned until the following business day.  We are closed weekends and major holidays. You have access to a nurse at all times for urgent questions. Please call the main number to the clinic Dept: [phone] and follow the prompts.   For any non-urgent questions, you may also contact your provider using MyChart. We now offer e-Visits for anyone 72 and older to request care online for non-urgent symptoms. For details visit [email or web address].   Also download the MyChart app! Go to the app store, search "MyChart", open the app, select Angela Marshall, and log in with your MyChart username and password.

## 2024-07-12 ENCOUNTER — Inpatient Hospital Stay

## 2024-07-13 ENCOUNTER — Inpatient Hospital Stay

## 2024-07-13 VITALS — BP 134/87 | HR 82 | Temp 98.1°F | Resp 18

## 2024-07-13 DIAGNOSIS — Z5112 Encounter for antineoplastic immunotherapy: Secondary | ICD-10-CM | POA: Diagnosis not present

## 2024-07-13 DIAGNOSIS — C50411 Malignant neoplasm of upper-outer quadrant of right female breast: Secondary | ICD-10-CM

## 2024-07-13 MED ORDER — PEGFILGRASTIM-JMDB 6 MG/0.6ML ~~LOC~~ SOSY
6.0000 mg | PREFILLED_SYRINGE | Freq: Once | SUBCUTANEOUS | Status: AC
  Administered 2024-07-13: 6 mg via SUBCUTANEOUS

## 2024-07-14 ENCOUNTER — Inpatient Hospital Stay

## 2024-07-14 ENCOUNTER — Inpatient Hospital Stay: Admitting: Adult Health

## 2024-07-17 ENCOUNTER — Inpatient Hospital Stay

## 2024-07-17 ENCOUNTER — Inpatient Hospital Stay: Admitting: Adult Health

## 2024-07-18 ENCOUNTER — Inpatient Hospital Stay

## 2024-07-19 ENCOUNTER — Inpatient Hospital Stay

## 2024-07-20 ENCOUNTER — Other Ambulatory Visit: Payer: Self-pay

## 2024-07-20 ENCOUNTER — Inpatient Hospital Stay

## 2024-07-20 ENCOUNTER — Telehealth: Payer: Self-pay

## 2024-07-20 DIAGNOSIS — C50411 Malignant neoplasm of upper-outer quadrant of right female breast: Secondary | ICD-10-CM

## 2024-07-20 NOTE — Telephone Encounter (Signed)
 Pt called and reported low grade fever of 100.5 at 1pm down from 100.7. She denies any flu like symptoms, denies coughing, chills, aches or any sx suggestive of a bacterial infection. Is experiencing nausea. RN advised pt on antiemetic treatment and to take tylenol  as directed on package.Pt is outside of neutropenic window from last treatment. RN advised her to manage her sx w/ tylenol  and antiemetics and to maintain adequate hydration and to notify us  if her fever worsens or is prolonged. Pt verbalized understanding.

## 2024-07-21 ENCOUNTER — Telehealth: Payer: Self-pay | Admitting: Hematology and Oncology

## 2024-07-21 NOTE — Telephone Encounter (Signed)
 Left vm for pt to call back and reschedule appt 1/26 due to inclement weather

## 2024-07-24 ENCOUNTER — Inpatient Hospital Stay

## 2024-07-24 ENCOUNTER — Inpatient Hospital Stay: Admitting: Hematology and Oncology

## 2024-07-26 ENCOUNTER — Inpatient Hospital Stay

## 2024-07-28 ENCOUNTER — Inpatient Hospital Stay

## 2024-07-28 ENCOUNTER — Other Ambulatory Visit: Payer: Self-pay

## 2024-07-28 ENCOUNTER — Encounter: Payer: Self-pay | Admitting: Hematology and Oncology

## 2024-07-28 ENCOUNTER — Inpatient Hospital Stay: Admitting: Adult Health

## 2024-07-28 ENCOUNTER — Encounter: Payer: Self-pay | Admitting: Adult Health

## 2024-07-28 VITALS — BP 120/73 | HR 84 | Temp 97.4°F | Resp 18 | Ht 64.0 in | Wt 220.6 lb

## 2024-07-28 DIAGNOSIS — Z170 Estrogen receptor positive status [ER+]: Secondary | ICD-10-CM

## 2024-07-28 DIAGNOSIS — L989 Disorder of the skin and subcutaneous tissue, unspecified: Secondary | ICD-10-CM

## 2024-07-28 DIAGNOSIS — C50411 Malignant neoplasm of upper-outer quadrant of right female breast: Secondary | ICD-10-CM | POA: Diagnosis not present

## 2024-07-28 DIAGNOSIS — Z5112 Encounter for antineoplastic immunotherapy: Secondary | ICD-10-CM | POA: Diagnosis not present

## 2024-07-28 LAB — CBC WITH DIFFERENTIAL (CANCER CENTER ONLY)
Abs Immature Granulocytes: 0.4 10*3/uL — ABNORMAL HIGH (ref 0.00–0.07)
Basophils Absolute: 0 10*3/uL (ref 0.0–0.1)
Basophils Relative: 0 %
Eosinophils Absolute: 0 10*3/uL (ref 0.0–0.5)
Eosinophils Relative: 0 %
HCT: 24.6 % — ABNORMAL LOW (ref 36.0–46.0)
Hemoglobin: 8.2 g/dL — ABNORMAL LOW (ref 12.0–15.0)
Immature Granulocytes: 5 %
Lymphocytes Relative: 26 %
Lymphs Abs: 1.9 10*3/uL (ref 0.7–4.0)
MCH: 31.1 pg (ref 26.0–34.0)
MCHC: 33.3 g/dL (ref 30.0–36.0)
MCV: 93.2 fL (ref 80.0–100.0)
Monocytes Absolute: 1.1 10*3/uL — ABNORMAL HIGH (ref 0.1–1.0)
Monocytes Relative: 15 %
Neutro Abs: 4.1 10*3/uL (ref 1.7–7.7)
Neutrophils Relative %: 54 %
Platelet Count: 373 10*3/uL (ref 150–400)
RBC: 2.64 MIL/uL — ABNORMAL LOW (ref 3.87–5.11)
RDW: 17.2 % — ABNORMAL HIGH (ref 11.5–15.5)
WBC Count: 7.6 10*3/uL (ref 4.0–10.5)
nRBC: 1.2 % — ABNORMAL HIGH (ref 0.0–0.2)

## 2024-07-28 LAB — CMP (CANCER CENTER ONLY)
ALT: 18 U/L (ref 0–44)
AST: 18 U/L (ref 15–41)
Albumin: 4.1 g/dL (ref 3.5–5.0)
Alkaline Phosphatase: 62 U/L (ref 38–126)
Anion gap: 12 (ref 5–15)
BUN: 15 mg/dL (ref 6–20)
CO2: 26 mmol/L (ref 22–32)
Calcium: 9.1 mg/dL (ref 8.9–10.3)
Chloride: 103 mmol/L (ref 98–111)
Creatinine: 1.18 mg/dL — ABNORMAL HIGH (ref 0.44–1.00)
GFR, Estimated: 56 mL/min — ABNORMAL LOW
Glucose, Bld: 97 mg/dL (ref 70–99)
Potassium: 3.4 mmol/L — ABNORMAL LOW (ref 3.5–5.1)
Sodium: 140 mmol/L (ref 135–145)
Total Bilirubin: 0.2 mg/dL (ref 0.0–1.2)
Total Protein: 7.1 g/dL (ref 6.5–8.1)

## 2024-07-28 NOTE — Progress Notes (Unsigned)
 [facility] Cancer Follow up:    Angela Nell SAILOR, FNP [address] [name] [address]   DIAGNOSIS: Cancer Staging  Malignant neoplasm of upper-outer quadrant of right breast in female, estrogen receptor positive (HCC) Staging form: Breast, AJCC 8th Edition - Clinical: Stage IIB (cT2, cN0, cM0, G3, ER-, PR-, HER2-) - Signed by Odean Potts, MD on 04/05/2024 Stage prefix: Initial diagnosis Histologic grading system: 3 grade system    SUMMARY OF ONCOLOGIC HISTORY: Oncology History  Malignant neoplasm of upper-outer quadrant of right breast in female, estrogen receptor positive (HCC)  03/30/2024 Initial Diagnosis   Screening mammogram detected right upper outer quadrant mass 2.6 cm by mammogram, by ultrasound it measured 2.2 cm at 10 o'clock position, axilla negative.  Left breast: Cysts: Aspirated Biopsy: Grade 3 IDC ER 2%, PR 0%, Ki67 60%, HER2 negative   04/05/2024 Cancer Staging   Staging form: Breast, AJCC 8th Edition - Clinical: Stage IIB (cT2, cN0, cM0, G3, ER-, PR-, HER2-) - Signed by Odean Potts, MD on 04/05/2024 Stage prefix: Initial diagnosis Histologic grading system: 3 grade system   04/15/2024 Genetic Testing   Negative genetic testing. Report date: 04/15/2024  The [facility] CancerNext+RNAinsight Panel includes sequencing, rearrangement analysis, and RNA analysis for the following 40 genes: APC, ATM, BAP1, BARD1, BMPR1A, BRCA1, BRCA2, BRIP1, CDH1, CDKN2A, CHEK2, FH, FLCN, MET, MLH1, MSH2, MSH6, MUTYH, NF1, NTHL1, PALB2, PMS2, PTEN, RAD51C, RAD51D, RPS20, SMAD4, STK11, TP53, TSC1, TSC2 and VHL (sequencing and deletion/duplication); AXIN2, HOXB13, MBD4, MSH3, POLD1 and POLE (sequencing only); EPCAM and GREM1 (deletion/duplication only). RNA data is routinely analyzed for use in variant interpretation for all genes.    04/21/2024 -  Chemotherapy   Patient is on Treatment Plan : BREAST Pembrolizumab  (200) D1 + Carboplatin  (1.5) D1,8,15 +  Paclitaxel  (80) D1,8,15 q21d X 4 cycles / Pembrolizumab  (200) D1 + AC D1 q21d x 4 cycles      Genetic Testing   Negative genetic testing. Report date: 04/15/2024  The [facility] CancerNext+RNAinsight Panel includes sequencing, rearrangement analysis, and RNA analysis for the following 40 genes: APC, ATM, BAP1, BARD1, BMPR1A, BRCA1, BRCA2, BRIP1, CDH1, CDKN2A, CHEK2, FH, FLCN, MET, MLH1, MSH2, MSH6, MUTYH, NF1, NTHL1, PALB2, PMS2, PTEN, RAD51C, RAD51D, RPS20, SMAD4, STK11, TP53, TSC1, TSC2 and VHL (sequencing and deletion/duplication); AXIN2, HOXB13, MBD4, MSH3, POLD1 and POLE (sequencing only); EPCAM and GREM1 (deletion/duplication only). RNA data is routinely analyzed for use in variant interpretation for all genes.      CURRENT THERAPY:  INTERVAL HISTORY:  Discussed the use of AI scribe software for clinical note transcription with the patient, who gave verbal consent to proceed.  History of Present Illness      Patient Active Problem List   Diagnosis Date Noted   Genetic testing 04/26/2024   Port-A-Cath in place 04/21/2024   Family history of brain cancer    Family history of breast cancer    Family history of ovarian cancer    Breast cancer (HCC)    Malignant neoplasm of upper-outer quadrant of right breast in female, estrogen receptor positive (HCC) 04/03/2024    is allergic to penicillins.  MEDICAL HISTORY: Past Medical History:  Diagnosis Date   Anxiety    Breast cancer (HCC)    hx right breast ca in 02/2024   Family history of brain cancer    Father at 9   Family history of breast cancer    maternal grandmother   Family history of ovarian cancer    Paternal  1st Cousin at 78   HLD (hyperlipidemia)    Hypertension    Seasonal allergies     SURGICAL HISTORY: Past Surgical History:  Procedure Laterality Date   ABDOMINOPLASTY     HERNIA REPAIR     as a child 11 -55yrs old   IR CV LINE INJECTION  07/05/2024   PORTACATH PLACEMENT N/A 04/12/2024    Procedure: INSERTION, TUNNELED CENTRAL VENOUS DEVICE, WITH PORT;  Surgeon: Vernetta Berg, MD;  Location: [facility] OR;  Service: General;  Laterality: N/A;  PORT PLACEMENT WITH ULTRASOUND GUIDANCE   WISDOM TOOTH EXTRACTION      SOCIAL HISTORY: Social History   Socioeconomic History   Marital status: Legally Separated    Spouse name: Not on file   Number of children: Not on file   Years of education: Not on file   Highest education level: Not on file  Occupational History   Not on file  Tobacco Use   Smoking status: Former    Current packs/day: 0.25    Types: Cigarettes   Smokeless tobacco: Never   Tobacco comments:    Quit smoking 09/2023  Vaping Use   Vaping status: Former  Substance and Sexual Activity   Alcohol use: Yes    Alcohol/week: 1.0 - 2.0 standard drink of alcohol    Types: 1 - 2 Standard drinks or equivalent per week    Comment: wine/liquor   Drug use: Not on file    Comment: CBD Gummies   Sexual activity: Yes    Birth control/protection: Post-menopausal  Other Topics Concern   Not on file  Social History Narrative   Not on file   Social Drivers of Health   Tobacco Use: Medium Risk (07/04/2024)   Patient History    Smoking Tobacco Use: Former    Smokeless Tobacco Use: Never    Passive Exposure: Not on Actuary Strain: Not on file  Food Insecurity: No Food Insecurity (06/16/2024)   Epic    Worried About Programme Researcher, Broadcasting/film/video in the Last Year: Never true    Ran Out of Food in the Last Year: Never true  Transportation Needs: No Transportation Needs (06/16/2024)   Epic    Lack of Transportation (Medical): No    Lack of Transportation (Non-Medical): No  Physical Activity: Not on file  Stress: Not on file  Social Connections: Moderately Isolated (06/16/2024)   Social Connection and Isolation Panel    Frequency of Communication with Friends and Family: More than three times a week    Frequency of Social Gatherings with  Friends and Family: Twice a week    Attends Religious Services: More than 4 times per year    Active Member of Clubs or Organizations: No    Attends Banker Meetings: Never    Marital Status: Never married  Intimate Partner Violence: Not At Risk (06/16/2024)   Epic    Fear of Current or Ex-Partner: No    Emotionally Abused: No    Physically Abused: No    Sexually Abused: No  Depression (PHQ2-9): Low Risk (07/11/2024)   Depression (PHQ2-9)    PHQ-2 Score: 0  Alcohol Screen: Low Risk (06/16/2024)   Alcohol Screen    Last Alcohol Screening Score (AUDIT): 0  Housing: Unknown (06/16/2024)   Epic    Unable to Pay for Housing in the Last Year: No    Number of Times Moved in the Last Year: Not on file    Homeless in the Last Year:  No  Utilities: Not At Risk (06/16/2024)   Epic    Threatened with loss of utilities: No  Recent Concern: Utilities - At Risk (04/21/2024)   Epic    Threatened with loss of utilities: Yes  Health Literacy: Adequate Health Literacy (06/16/2024)   B1300 Health Literacy    Frequency of need for help with medical instructions: Never    FAMILY HISTORY: Family History  Problem Relation Age of Onset   Brain cancer Father 56   Breast cancer Maternal Grandmother 11   Cancer Paternal Aunt 27 - 9       Unknown Type   Ovarian cancer Paternal Cousin 66   Cancer Paternal Uncle        Unknown type    Review of Systems - Oncology    PHYSICAL EXAMINATION    Vitals:   07/28/24 1213  BP: 120/73  Pulse: 84  Resp: 18  Temp: (!) 97.4 F (36.3 C)  SpO2: 100%    Physical Exam  LABORATORY DATA:  CBC    Component Value Date/Time   WBC 7.6 07/28/2024 1140   WBC 8.8 04/25/2024 0056   RBC 2.64 (L) 07/28/2024 1140   HGB 8.2 (L) 07/28/2024 1140   HCT 24.6 (L) 07/28/2024 1140   PLT 373 07/28/2024 1140   MCV 93.2 07/28/2024 1140   MCH 31.1 07/28/2024 1140   MCHC 33.3 07/28/2024 1140   RDW 17.2 (H) 07/28/2024 1140    LYMPHSABS 1.9 07/28/2024 1140   MONOABS 1.1 (H) 07/28/2024 1140   EOSABS 0.0 07/28/2024 1140   BASOSABS 0.0 07/28/2024 1140    CMP     Component Value Date/Time   NA 140 07/28/2024 1140   K 3.4 (L) 07/28/2024 1140   CL 103 07/28/2024 1140   CO2 26 07/28/2024 1140   GLUCOSE 97 07/28/2024 1140   BUN 15 07/28/2024 1140   CREATININE 1.18 (H) 07/28/2024 1140   CALCIUM 9.1 07/28/2024 1140   PROT 7.1 07/28/2024 1140   ALBUMIN 4.1 07/28/2024 1140   AST 18 07/28/2024 1140   ALT 18 07/28/2024 1140   ALKPHOS 62 07/28/2024 1140   BILITOT 0.2 07/28/2024 1140   GFRNONAA 56 (L) 07/28/2024 1140     ASSESSMENT and THERAPY PLAN:   No problem-specific Assessment & Plan notes found for this encounter.   Assessment and Plan Assessment & Plan       All questions were answered. The patient knows to call the clinic with any problems, questions or concerns. We can certainly see the patient much sooner if necessary.  Total encounter time:*** minutes*in face-to-face visit time, chart review, lab review, care coordination, order entry, and documentation of the encounter time.    Morna Kendall, NP 07/28/24 12:20 PM Medical Oncology and Hematology [facility] [address] [place], [place] [address] Tel. [phone]    Fax. [phone]  *Total Encounter Time as defined by the Centers for Medicare and Medicaid Services includes, in addition to the face-to-face time of a patient visit (documented in the note above) non-face-to-face time: obtaining and reviewing outside history, ordering and reviewing medications, tests or procedures, care coordination (communications with other health care professionals or caregivers) and documentation in the medical record.

## 2024-07-30 ENCOUNTER — Encounter: Payer: Self-pay | Admitting: Hematology and Oncology

## 2024-07-31 ENCOUNTER — Encounter: Payer: Self-pay | Admitting: Hematology and Oncology

## 2024-08-01 ENCOUNTER — Encounter: Payer: Self-pay | Admitting: Licensed Clinical Social Worker

## 2024-08-01 ENCOUNTER — Encounter: Payer: Self-pay | Admitting: *Deleted

## 2024-08-01 ENCOUNTER — Inpatient Hospital Stay: Attending: Hematology and Oncology

## 2024-08-01 ENCOUNTER — Inpatient Hospital Stay: Admitting: Hematology and Oncology

## 2024-08-01 ENCOUNTER — Inpatient Hospital Stay

## 2024-08-01 VITALS — BP 127/71 | HR 87 | Temp 98.2°F | Resp 18

## 2024-08-01 VITALS — BP 118/78 | HR 99 | Temp 97.9°F | Resp 22 | Ht 64.0 in | Wt 222.0 lb

## 2024-08-01 DIAGNOSIS — Z170 Estrogen receptor positive status [ER+]: Secondary | ICD-10-CM | POA: Diagnosis not present

## 2024-08-01 DIAGNOSIS — C50411 Malignant neoplasm of upper-outer quadrant of right female breast: Secondary | ICD-10-CM

## 2024-08-01 LAB — CBC WITH DIFFERENTIAL (CANCER CENTER ONLY)
Abs Immature Granulocytes: 0.02 10*3/uL (ref 0.00–0.07)
Basophils Absolute: 0 10*3/uL (ref 0.0–0.1)
Basophils Relative: 0 %
Eosinophils Absolute: 0 10*3/uL (ref 0.0–0.5)
Eosinophils Relative: 0 %
HCT: 25.6 % — ABNORMAL LOW (ref 36.0–46.0)
Hemoglobin: 8.3 g/dL — ABNORMAL LOW (ref 12.0–15.0)
Immature Granulocytes: 0 %
Lymphocytes Relative: 30 %
Lymphs Abs: 1.5 10*3/uL (ref 0.7–4.0)
MCH: 30.9 pg (ref 26.0–34.0)
MCHC: 32.4 g/dL (ref 30.0–36.0)
MCV: 95.2 fL (ref 80.0–100.0)
Monocytes Absolute: 0.6 10*3/uL (ref 0.1–1.0)
Monocytes Relative: 11 %
Neutro Abs: 2.8 10*3/uL (ref 1.7–7.7)
Neutrophils Relative %: 59 %
Platelet Count: 356 10*3/uL (ref 150–400)
RBC: 2.69 MIL/uL — ABNORMAL LOW (ref 3.87–5.11)
RDW: 18.6 % — ABNORMAL HIGH (ref 11.5–15.5)
WBC Count: 4.8 10*3/uL (ref 4.0–10.5)
nRBC: 0.8 % — ABNORMAL HIGH (ref 0.0–0.2)

## 2024-08-01 LAB — CMP (CANCER CENTER ONLY)
ALT: 21 U/L (ref 0–44)
AST: 21 U/L (ref 15–41)
Albumin: 4 g/dL (ref 3.5–5.0)
Alkaline Phosphatase: 52 U/L (ref 38–126)
Anion gap: 12 (ref 5–15)
BUN: 14 mg/dL (ref 6–20)
CO2: 25 mmol/L (ref 22–32)
Calcium: 9.1 mg/dL (ref 8.9–10.3)
Chloride: 104 mmol/L (ref 98–111)
Creatinine: 1.19 mg/dL — ABNORMAL HIGH (ref 0.44–1.00)
GFR, Estimated: 55 mL/min — ABNORMAL LOW
Glucose, Bld: 114 mg/dL — ABNORMAL HIGH (ref 70–99)
Potassium: 3.4 mmol/L — ABNORMAL LOW (ref 3.5–5.1)
Sodium: 141 mmol/L (ref 135–145)
Total Bilirubin: 0.3 mg/dL (ref 0.0–1.2)
Total Protein: 6.9 g/dL (ref 6.5–8.1)

## 2024-08-01 LAB — TSH: TSH: 0.974 u[IU]/mL (ref 0.350–4.500)

## 2024-08-01 MED ORDER — SODIUM CHLORIDE 0.9 % IV SOLN
200.0000 mg | Freq: Once | INTRAVENOUS | Status: AC
  Administered 2024-08-01: 200 mg via INTRAVENOUS
  Filled 2024-08-01: qty 200

## 2024-08-01 MED ORDER — SODIUM CHLORIDE 0.9 % IV SOLN
400.0000 mg/m2 | Freq: Once | INTRAVENOUS | Status: AC
  Administered 2024-08-01: 840 mg via INTRAVENOUS
  Filled 2024-08-01: qty 42

## 2024-08-01 MED ORDER — SODIUM CHLORIDE 0.9 % IV SOLN: INTRAVENOUS | Status: DC

## 2024-08-01 MED ORDER — SODIUM CHLORIDE 0.9 % IV SOLN
150.0000 mg | Freq: Once | INTRAVENOUS | Status: AC
  Administered 2024-08-01: 150 mg via INTRAVENOUS
  Filled 2024-08-01: qty 150

## 2024-08-01 MED ORDER — DEXAMETHASONE SOD PHOSPHATE PF 10 MG/ML IJ SOLN
10.0000 mg | Freq: Once | INTRAMUSCULAR | Status: AC
  Administered 2024-08-01: 10 mg via INTRAVENOUS
  Filled 2024-08-01: qty 1

## 2024-08-01 MED ORDER — DOXORUBICIN HCL CHEMO IV INJECTION 2 MG/ML
50.0000 mg/m2 | Freq: Once | INTRAVENOUS | Status: AC
  Administered 2024-08-01: 106 mg via INTRAVENOUS
  Filled 2024-08-01: qty 53

## 2024-08-01 MED ORDER — PALONOSETRON HCL INJECTION 0.25 MG/5ML
0.2500 mg | Freq: Once | INTRAVENOUS | Status: AC
  Administered 2024-08-01: 0.25 mg via INTRAVENOUS
  Filled 2024-08-01: qty 5

## 2024-08-01 NOTE — Assessment & Plan Note (Signed)
 03/30/2024:Screening mammogram detected right upper outer quadrant mass 2.6 cm by mammogram, by ultrasound it measured 2.2 cm at 10 o'clock position, axilla negative.  Left breast: Cysts: Aspirated Biopsy: Grade 3 IDC ER 2%, PR 0%, Ki67 60%, HER2 negative   Treatment plan: Neoadjuvant chemotherapy with Taxol  carbo Keytruda  followed by Adriamycin  Cytoxan  Keytruda  followed by Keytruda  maintenance for 1 year started 04/21/2024 Breast conserving surgery with sentinel lymph node biopsy Adjuvant radiation ------------------------------------------------------------------------------------------------------------------------------------ Current treatment: Completed 8 cycles of Taxol  carbo Keytruda , discontinued for neuropathy.  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

## 2024-08-02 LAB — VIRUS CULTURE

## 2024-08-02 LAB — T4: T4, Total: 6 ug/dL (ref 4.5–12.0)

## 2024-08-03 ENCOUNTER — Inpatient Hospital Stay

## 2024-08-03 ENCOUNTER — Other Ambulatory Visit (HOSPITAL_COMMUNITY): Payer: Self-pay

## 2024-08-03 ENCOUNTER — Encounter: Payer: Self-pay | Admitting: Hematology and Oncology

## 2024-08-03 VITALS — BP 115/72 | HR 72 | Temp 97.9°F | Resp 18

## 2024-08-03 DIAGNOSIS — C50411 Malignant neoplasm of upper-outer quadrant of right female breast: Secondary | ICD-10-CM

## 2024-08-03 MED ORDER — PEGFILGRASTIM-JMDB 6 MG/0.6ML ~~LOC~~ SOSY
6.0000 mg | PREFILLED_SYRINGE | Freq: Once | SUBCUTANEOUS | Status: AC
  Administered 2024-08-03: 6 mg via SUBCUTANEOUS
  Filled 2024-08-03: qty 0.6

## 2024-08-14 ENCOUNTER — Inpatient Hospital Stay: Admitting: Hematology and Oncology

## 2024-08-14 ENCOUNTER — Inpatient Hospital Stay

## 2024-08-16 ENCOUNTER — Inpatient Hospital Stay

## 2024-08-22 ENCOUNTER — Inpatient Hospital Stay: Admitting: Physician Assistant

## 2024-08-22 ENCOUNTER — Inpatient Hospital Stay

## 2024-08-24 ENCOUNTER — Inpatient Hospital Stay

## 2024-09-04 ENCOUNTER — Inpatient Hospital Stay

## 2024-09-04 ENCOUNTER — Inpatient Hospital Stay: Admitting: Hematology and Oncology

## 2024-09-06 ENCOUNTER — Inpatient Hospital Stay
